# Patient Record
Sex: Female | Born: 1965 | Race: White | Hispanic: Yes | Smoking: Never smoker
Health system: Southern US, Community
[De-identification: ages and names within clinical notes are randomized; demographics above are authoritative.]

## PROBLEM LIST (undated history)

## (undated) DIAGNOSIS — B029 Zoster without complications: Secondary | ICD-10-CM

## (undated) DIAGNOSIS — T7840XA Allergy, unspecified, initial encounter: Secondary | ICD-10-CM

## (undated) DIAGNOSIS — E785 Hyperlipidemia, unspecified: Secondary | ICD-10-CM

## (undated) DIAGNOSIS — G43909 Migraine, unspecified, not intractable, without status migrainosus: Secondary | ICD-10-CM

## (undated) HISTORY — DX: Zoster without complications: B02.9

## (undated) HISTORY — DX: Allergy, unspecified, initial encounter: T78.40XA

## (undated) HISTORY — DX: Migraine, unspecified, not intractable, without status migrainosus: G43.909

---

## 1995-07-01 HISTORY — PX: TUBAL LIGATION: SHX77

## 1999-01-23 ENCOUNTER — Ambulatory Visit (HOSPITAL_COMMUNITY): Admission: RE | Admit: 1999-01-23 | Discharge: 1999-01-23 | Payer: Self-pay | Admitting: Family Medicine

## 1999-01-23 ENCOUNTER — Encounter: Payer: Self-pay | Admitting: Family Medicine

## 1999-06-14 ENCOUNTER — Emergency Department (HOSPITAL_COMMUNITY): Admission: EM | Admit: 1999-06-14 | Discharge: 1999-06-14 | Payer: Self-pay | Admitting: Emergency Medicine

## 1999-07-01 DIAGNOSIS — T7840XA Allergy, unspecified, initial encounter: Secondary | ICD-10-CM

## 1999-07-01 HISTORY — DX: Allergy, unspecified, initial encounter: T78.40XA

## 2000-01-23 ENCOUNTER — Ambulatory Visit (HOSPITAL_COMMUNITY): Admission: RE | Admit: 2000-01-23 | Discharge: 2000-01-23 | Payer: Self-pay | Admitting: Family Medicine

## 2000-01-23 ENCOUNTER — Encounter: Payer: Self-pay | Admitting: Family Medicine

## 2000-08-03 ENCOUNTER — Encounter: Payer: Self-pay | Admitting: Emergency Medicine

## 2000-08-03 ENCOUNTER — Emergency Department (HOSPITAL_COMMUNITY): Admission: EM | Admit: 2000-08-03 | Discharge: 2000-08-03 | Payer: Self-pay | Admitting: Emergency Medicine

## 2001-09-16 ENCOUNTER — Ambulatory Visit (HOSPITAL_COMMUNITY): Admission: RE | Admit: 2001-09-16 | Discharge: 2001-09-16 | Payer: Self-pay | Admitting: Gastroenterology

## 2002-05-12 ENCOUNTER — Ambulatory Visit (HOSPITAL_COMMUNITY): Admission: RE | Admit: 2002-05-12 | Discharge: 2002-05-12 | Payer: Self-pay | Admitting: Family Medicine

## 2003-01-04 ENCOUNTER — Other Ambulatory Visit: Admission: RE | Admit: 2003-01-04 | Discharge: 2003-01-04 | Payer: Self-pay | Admitting: *Deleted

## 2004-01-15 ENCOUNTER — Ambulatory Visit (HOSPITAL_COMMUNITY): Admission: RE | Admit: 2004-01-15 | Discharge: 2004-01-15 | Payer: Self-pay | Admitting: Family Medicine

## 2004-05-01 ENCOUNTER — Ambulatory Visit: Payer: Self-pay | Admitting: Family Medicine

## 2004-05-02 ENCOUNTER — Ambulatory Visit: Payer: Self-pay | Admitting: *Deleted

## 2004-05-30 ENCOUNTER — Encounter (INDEPENDENT_AMBULATORY_CARE_PROVIDER_SITE_OTHER): Payer: Self-pay | Admitting: Specialist

## 2004-05-30 ENCOUNTER — Ambulatory Visit: Payer: Self-pay | Admitting: Family Medicine

## 2004-05-30 ENCOUNTER — Other Ambulatory Visit: Admission: RE | Admit: 2004-05-30 | Discharge: 2004-05-30 | Payer: Self-pay | Admitting: Family Medicine

## 2004-09-02 ENCOUNTER — Ambulatory Visit: Payer: Self-pay | Admitting: Family Medicine

## 2004-09-11 ENCOUNTER — Ambulatory Visit: Payer: Self-pay | Admitting: Internal Medicine

## 2004-10-10 ENCOUNTER — Other Ambulatory Visit: Admission: RE | Admit: 2004-10-10 | Discharge: 2004-10-10 | Payer: Self-pay | Admitting: Family Medicine

## 2004-10-10 ENCOUNTER — Ambulatory Visit: Payer: Self-pay | Admitting: Family Medicine

## 2004-10-24 ENCOUNTER — Ambulatory Visit: Payer: Self-pay | Admitting: Family Medicine

## 2004-11-15 ENCOUNTER — Ambulatory Visit: Payer: Self-pay | Admitting: Family Medicine

## 2005-03-12 ENCOUNTER — Ambulatory Visit: Payer: Self-pay | Admitting: Internal Medicine

## 2005-06-20 ENCOUNTER — Ambulatory Visit: Payer: Self-pay | Admitting: Family Medicine

## 2005-07-19 ENCOUNTER — Emergency Department (HOSPITAL_COMMUNITY): Admission: EM | Admit: 2005-07-19 | Discharge: 2005-07-19 | Payer: Self-pay | Admitting: Family Medicine

## 2005-07-22 ENCOUNTER — Ambulatory Visit: Payer: Self-pay | Admitting: Family Medicine

## 2005-12-29 ENCOUNTER — Ambulatory Visit: Payer: Self-pay | Admitting: Family Medicine

## 2005-12-30 ENCOUNTER — Ambulatory Visit (HOSPITAL_COMMUNITY): Admission: RE | Admit: 2005-12-30 | Discharge: 2005-12-30 | Payer: Self-pay | Admitting: Family Medicine

## 2006-01-21 ENCOUNTER — Ambulatory Visit: Payer: Self-pay | Admitting: Obstetrics and Gynecology

## 2006-01-21 ENCOUNTER — Encounter: Payer: Self-pay | Admitting: Obstetrics and Gynecology

## 2006-01-27 ENCOUNTER — Ambulatory Visit (HOSPITAL_COMMUNITY): Admission: RE | Admit: 2006-01-27 | Discharge: 2006-01-27 | Payer: Self-pay | Admitting: Obstetrics and Gynecology

## 2006-02-16 ENCOUNTER — Ambulatory Visit: Payer: Self-pay | Admitting: Family Medicine

## 2006-06-15 ENCOUNTER — Emergency Department (HOSPITAL_COMMUNITY): Admission: EM | Admit: 2006-06-15 | Discharge: 2006-06-15 | Payer: Self-pay | Admitting: Family Medicine

## 2006-07-17 ENCOUNTER — Ambulatory Visit: Payer: Self-pay | Admitting: Family Medicine

## 2006-11-02 ENCOUNTER — Ambulatory Visit: Payer: Self-pay | Admitting: Family Medicine

## 2006-11-20 ENCOUNTER — Emergency Department (HOSPITAL_COMMUNITY): Admission: EM | Admit: 2006-11-20 | Discharge: 2006-11-20 | Payer: Self-pay | Admitting: Family Medicine

## 2006-11-25 ENCOUNTER — Emergency Department (HOSPITAL_COMMUNITY): Admission: EM | Admit: 2006-11-25 | Discharge: 2006-11-25 | Payer: Self-pay | Admitting: Emergency Medicine

## 2006-12-08 ENCOUNTER — Ambulatory Visit: Payer: Self-pay | Admitting: Family Medicine

## 2007-01-11 ENCOUNTER — Ambulatory Visit: Payer: Self-pay | Admitting: Family Medicine

## 2007-01-11 ENCOUNTER — Encounter (INDEPENDENT_AMBULATORY_CARE_PROVIDER_SITE_OTHER): Payer: Self-pay | Admitting: Family Medicine

## 2007-03-17 ENCOUNTER — Encounter (INDEPENDENT_AMBULATORY_CARE_PROVIDER_SITE_OTHER): Payer: Self-pay | Admitting: *Deleted

## 2007-04-01 ENCOUNTER — Ambulatory Visit: Payer: Self-pay | Admitting: Family Medicine

## 2007-07-08 ENCOUNTER — Ambulatory Visit: Payer: Self-pay | Admitting: Family Medicine

## 2007-07-12 ENCOUNTER — Ambulatory Visit (HOSPITAL_COMMUNITY): Admission: RE | Admit: 2007-07-12 | Discharge: 2007-07-12 | Payer: Self-pay | Admitting: Family Medicine

## 2007-08-12 ENCOUNTER — Emergency Department (HOSPITAL_COMMUNITY): Admission: EM | Admit: 2007-08-12 | Discharge: 2007-08-12 | Payer: Self-pay | Admitting: Family Medicine

## 2007-08-16 ENCOUNTER — Ambulatory Visit: Payer: Self-pay | Admitting: Family Medicine

## 2007-10-27 ENCOUNTER — Emergency Department (HOSPITAL_COMMUNITY): Admission: EM | Admit: 2007-10-27 | Discharge: 2007-10-27 | Payer: Self-pay | Admitting: Emergency Medicine

## 2008-01-13 ENCOUNTER — Ambulatory Visit: Payer: Self-pay | Admitting: Internal Medicine

## 2008-01-17 ENCOUNTER — Ambulatory Visit (HOSPITAL_COMMUNITY): Admission: RE | Admit: 2008-01-17 | Discharge: 2008-01-17 | Payer: Self-pay | Admitting: Family Medicine

## 2008-02-10 ENCOUNTER — Emergency Department (HOSPITAL_COMMUNITY): Admission: EM | Admit: 2008-02-10 | Discharge: 2008-02-10 | Payer: Self-pay | Admitting: Family Medicine

## 2008-07-24 ENCOUNTER — Ambulatory Visit: Payer: Self-pay | Admitting: Internal Medicine

## 2008-08-11 ENCOUNTER — Ambulatory Visit: Payer: Self-pay | Admitting: Family Medicine

## 2008-08-11 LAB — CONVERTED CEMR LAB
ALT: 14 units/L (ref 0–35)
AST: 18 units/L (ref 0–37)
Alkaline Phosphatase: 64 units/L (ref 39–117)
BUN: 18 mg/dL (ref 6–23)
Basophils Absolute: 0.1 10*3/uL (ref 0.0–0.1)
Chloride: 105 meq/L (ref 96–112)
Creatinine, Ser: 0.88 mg/dL (ref 0.40–1.20)
Eosinophils Absolute: 0.3 10*3/uL (ref 0.0–0.7)
Glucose, Bld: 85 mg/dL (ref 70–99)
Hemoglobin: 13.7 g/dL (ref 12.0–15.0)
Monocytes Absolute: 0.5 10*3/uL (ref 0.1–1.0)
Neutrophils Relative %: 42 % — ABNORMAL LOW (ref 43–77)
Platelets: 265 10*3/uL (ref 150–400)
Potassium: 4.2 meq/L (ref 3.5–5.3)
RBC: 4.72 M/uL (ref 3.87–5.11)
RDW: 13.3 % (ref 11.5–15.5)
Sodium: 140 meq/L (ref 135–145)
Vit D, 25-Hydroxy: 17 ng/mL — ABNORMAL LOW (ref 30–89)
WBC: 7.6 10*3/uL (ref 4.0–10.5)

## 2008-08-21 ENCOUNTER — Ambulatory Visit: Payer: Self-pay | Admitting: Family Medicine

## 2008-09-08 ENCOUNTER — Emergency Department (HOSPITAL_COMMUNITY): Admission: EM | Admit: 2008-09-08 | Discharge: 2008-09-08 | Payer: Self-pay | Admitting: Family Medicine

## 2009-02-08 ENCOUNTER — Ambulatory Visit: Payer: Self-pay | Admitting: Family Medicine

## 2009-02-08 LAB — CONVERTED CEMR LAB: TSH: 0.979 microintl units/mL (ref 0.350–4.500)

## 2009-03-12 ENCOUNTER — Ambulatory Visit (HOSPITAL_COMMUNITY): Admission: RE | Admit: 2009-03-12 | Discharge: 2009-03-12 | Payer: Self-pay | Admitting: Family Medicine

## 2009-03-14 ENCOUNTER — Ambulatory Visit: Payer: Self-pay | Admitting: Internal Medicine

## 2009-03-14 ENCOUNTER — Other Ambulatory Visit: Admission: RE | Admit: 2009-03-14 | Discharge: 2009-03-14 | Payer: Self-pay | Admitting: Family Medicine

## 2009-03-14 ENCOUNTER — Encounter: Payer: Self-pay | Admitting: Family Medicine

## 2009-03-14 LAB — CONVERTED CEMR LAB
Protein, ur: NEGATIVE mg/dL
Specific Gravity, Urine: 1.01 (ref 1.005–1.030)
Urobilinogen, UA: 0.2 (ref 0.0–1.0)

## 2009-03-20 ENCOUNTER — Ambulatory Visit: Payer: Self-pay | Admitting: Family Medicine

## 2009-03-20 ENCOUNTER — Encounter (INDEPENDENT_AMBULATORY_CARE_PROVIDER_SITE_OTHER): Payer: Self-pay | Admitting: Family Medicine

## 2009-03-20 LAB — CONVERTED CEMR LAB
Chlamydia, DNA Probe: NEGATIVE
GC Probe Amp, Genital: NEGATIVE

## 2009-05-15 ENCOUNTER — Ambulatory Visit: Payer: Self-pay | Admitting: Family Medicine

## 2009-07-04 ENCOUNTER — Emergency Department (HOSPITAL_COMMUNITY): Admission: EM | Admit: 2009-07-04 | Discharge: 2009-07-04 | Payer: Self-pay | Admitting: Family Medicine

## 2009-08-22 ENCOUNTER — Emergency Department (HOSPITAL_COMMUNITY): Admission: EM | Admit: 2009-08-22 | Discharge: 2009-08-22 | Payer: Self-pay | Admitting: Emergency Medicine

## 2009-10-20 ENCOUNTER — Emergency Department (HOSPITAL_COMMUNITY): Admission: EM | Admit: 2009-10-20 | Discharge: 2009-10-20 | Payer: Self-pay | Admitting: Family Medicine

## 2009-11-02 ENCOUNTER — Ambulatory Visit: Payer: Self-pay | Admitting: Family Medicine

## 2010-01-23 ENCOUNTER — Ambulatory Visit: Payer: Self-pay | Admitting: Internal Medicine

## 2010-01-24 ENCOUNTER — Encounter (INDEPENDENT_AMBULATORY_CARE_PROVIDER_SITE_OTHER): Payer: Self-pay | Admitting: Adult Health

## 2010-02-26 ENCOUNTER — Ambulatory Visit: Payer: Self-pay | Admitting: Family Medicine

## 2010-04-01 ENCOUNTER — Emergency Department (HOSPITAL_COMMUNITY): Admission: EM | Admit: 2010-04-01 | Discharge: 2010-04-01 | Payer: Self-pay | Admitting: Emergency Medicine

## 2010-09-05 ENCOUNTER — Other Ambulatory Visit: Payer: Self-pay | Admitting: Family Medicine

## 2010-09-05 ENCOUNTER — Encounter (INDEPENDENT_AMBULATORY_CARE_PROVIDER_SITE_OTHER): Payer: Self-pay | Admitting: Family Medicine

## 2010-09-05 LAB — CONVERTED CEMR LAB
AST: 19 units/L (ref 0–37)
Alkaline Phosphatase: 65 units/L (ref 39–117)
Basophils Absolute: 0.1 10*3/uL (ref 0.0–0.1)
Basophils Relative: 1 % (ref 0–1)
Chloride: 103 meq/L (ref 96–112)
Cholesterol: 201 mg/dL — ABNORMAL HIGH (ref 0–200)
Creatinine, Ser: 0.74 mg/dL (ref 0.40–1.20)
Eosinophils Absolute: 0.2 10*3/uL (ref 0.0–0.7)
Eosinophils Relative: 4 % (ref 0–5)
Glucose, Bld: 89 mg/dL (ref 70–99)
HDL: 49 mg/dL (ref >39)
Neutrophils Relative %: 44 % (ref 43–77)
Potassium: 4.2 meq/L (ref 3.5–5.3)
RDW: 13.4 % (ref 11.5–15.5)
Sodium: 139 meq/L (ref 135–145)
Total CHOL/HDL Ratio: 4.1
Triglycerides: 163 mg/dL — ABNORMAL HIGH (ref <150)
Vit D, 25-Hydroxy: 32 ng/mL (ref 30–89)

## 2010-09-17 LAB — POCT URINALYSIS DIP (DEVICE)
Nitrite: NEGATIVE
Protein, ur: NEGATIVE mg/dL
Specific Gravity, Urine: <=1.005 (ref 1.005–1.030)
pH: 6 (ref 5.0–8.0)

## 2010-09-17 LAB — POCT I-STAT, CHEM 8
Calcium, Ion: 1.14 mmol/L (ref 1.12–1.32)
Chloride: 104 mEq/L (ref 96–112)
Creatinine, Ser: 0.9 mg/dL (ref 0.4–1.2)
Glucose, Bld: 93 mg/dL (ref 70–99)
HCT: 41 % (ref 36.0–46.0)
Sodium: 141 mEq/L (ref 135–145)

## 2010-10-10 LAB — POCT URINALYSIS DIP (DEVICE)
Bilirubin Urine: NEGATIVE
Ketones, ur: NEGATIVE mg/dL
Nitrite: NEGATIVE
Protein, ur: NEGATIVE mg/dL
Specific Gravity, Urine: 1.015 (ref 1.005–1.030)
Urobilinogen, UA: 0.2 mg/dL (ref 0.0–1.0)
pH: 7.5 (ref 5.0–8.0)

## 2010-11-15 NOTE — Procedures (Signed)
[place]  Patient:    Rachel Newton, Rachel Newton Visit Number: [id number] MRN: [id number]          Service Type: END Location: ENDO Attending Physician:  Charna Elizabeth Dictated by:   Anselmo Rod, M.D. Proc. Date: 09/16/01 Admit Date:  09/16/2001 Discharge Date: 09/16/2001   CC:         Aura Dials, M.D.  Juan H. Lily Peer, M.D.   Procedure Report  DATE OF BIRTH:  [birth year]  PROCEDURE PERFORMED:  Flexible sigmoidoscopy.  ENDOSCOPIST:  Anselmo Rod, M.D.  INSTRUMENT USED:  Olympus video colonoscope.  INDICATIONS FOR PROCEDURE:  Left lower quadrant pain in a 45 year old Hispanic female, rule out diverticulosis coli, etc.  PREPROCEDURE PREPARATION:  Informed consent was procured from the patient. The patient was fasted for eight hours prior to the procedure, and prepped with two Fleets enemas the morning of the procedure.  PREPROCEDURE PHYSICAL:  VITAL SIGNS:  Stable.  NECK:  Supple.  CHEST:  Clear to auscultation, S1 and S1 regular.  ABDOMEN:  Soft with normal bowel sounds.  DESCRIPTION OF PROCEDURE:  The patient was placed in the left lateral decubitus position, and sedated with 40 mg of Demerol and 4 mg of Versed intravenously.  Once the patient was adequately sedated, maintained on low flow oxygen and continuous cardiac monitoring, the Olympus video colonoscope was advanced from the rectum to 80 cm without difficulty.  The entire colonic mucosa appeared healthy without erosions, ulcerations, masses, polyps, or diverticulosis.  A small internal hemorrhoid was seen on retroflexion in the rectum.  The patient tolerated the procedure well without complication.  IMPRESSION:  Normal flexible sigmoidoscopy up to 80 cm except for small internal hemorrhoid.  No masses or polyps seen.  RECOMMENDATIONS: 1. High fiber diet has been recommended for the patient. 2. Outpatient follow up in the next two weeks. Dictated by:   Anselmo Rod, M.D. Attending Physician:  Charna Elizabeth DD:  09/16/01 TD:  09/19/01 Job: [id number]

## 2010-11-15 NOTE — Group Therapy Note (Signed)
NAME:  Rachel Newton, Rachel Newton NO.:  [id number]   MEDICAL RECORD NO.:  [id number]          PATIENT TYPE:   LOCATION:  [facility]                   FACILITY:  [facility]   PHYSICIAN:  Argentina Donovan, MD        DATE OF BIRTH:  [birth year]   DATE OF SERVICE:  07/22/2005                                    CLINIC NOTE   CHIEF COMPLAINT:  Follow up for cervical dysplasia.   SUBJECTIVE:  The patient is a 45 year old Hispanic female with a history of  low SIL on a Pap.  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  She is here for her routine q.6 month Pap  smear.   The patient also admits to having rectal pain with defecation and an  occasional bright red blood on the toilet paper when she goes to bathroom  and has a bowel movement.  In addition to the rectal pain with defecation,  she is also having episodic constipation.   PHYSICAL EXAMINATION:  VITAL SIGNS:  Noted.  GENERAL:  In no apparent distress.  Alert and oriented x3.  Obese Hispanic  female.  PELVIC EXAM:  External vaginal exam is normal.  External genital exam is  normal. The patient has no cervical motion tenderness, no adnexal tenderness  and no palpable uterine fibroids.  Pap smear was performed.  RECTAL EXAMINATION:  The patient has no external hemorrhoids or anal  fissures.  She does have one palpable internal hemorrhoid at approximately  12 o'clock 2 cm into the rectum.  This was exquisitely tender to palpation.   ASSESSMENT AND PLAN:  1.  Cervical dysplasia.  We will continue her q.6 month Pap smears until she      is passes screening and we will follow up her Pap smear that was      obtained today.  2.  Hemorrhoids.  I gave the patient a prescription for Anusol HC cream and      instructed her to apply t.i.d. to q.i.d. p.r.n. for pain.  I also gave      her prescription for Dulcolax that she is to take 10 mg p.o. daily      p.r.n. for constipation in addition to daily  Metamucil.     ______________________________  Dr. Tanya Nones    ______________________________  Argentina Donovan, MD    DP/MEDQ  D:  07/22/2005  T:  07/22/2005  Job:  [id number]

## 2012-06-28 ENCOUNTER — Ambulatory Visit: Payer: Self-pay | Admitting: Family Medicine

## 2012-06-28 VITALS — BP 130/100 | HR 88 | Temp 98.7°F | Resp 18 | Wt 179.0 lb

## 2012-06-28 DIAGNOSIS — N898 Other specified noninflammatory disorders of vagina: Secondary | ICD-10-CM

## 2012-06-28 DIAGNOSIS — L293 Anogenital pruritus, unspecified: Secondary | ICD-10-CM

## 2012-06-28 DIAGNOSIS — R350 Frequency of micturition: Secondary | ICD-10-CM

## 2012-06-28 DIAGNOSIS — B029 Zoster without complications: Secondary | ICD-10-CM

## 2012-06-28 LAB — POCT WET PREP WITH KOH: Yeast Wet Prep HPF POC: NEGATIVE

## 2012-06-28 LAB — POCT UA - MICROSCOPIC ONLY: Yeast, UA: NEGATIVE

## 2012-06-28 LAB — POCT URINALYSIS DIPSTICK
Bilirubin, UA: NEGATIVE
Glucose, UA: NEGATIVE
Ketones, UA: NEGATIVE
Nitrite, UA: NEGATIVE
pH, UA: 5.5

## 2012-06-28 MED ORDER — ACYCLOVIR 400 MG PO TABS: 800.0000 mg | ORAL_TABLET | Freq: Every day | ORAL | Status: DC

## 2012-06-28 MED ORDER — CEPHALEXIN 500 MG PO CAPS: 500.0000 mg | ORAL_CAPSULE | Freq: Two times a day (BID) | ORAL | Status: DC

## 2012-06-28 NOTE — Progress Notes (Signed)
[facility] [address], [place] [address] [phone]  Date:  06/28/2012   Name:  Rachel Newton   DOB:  Nov 28, 1965   MRN:  [id number]  PCP:  No primary provider on file.    Chief Complaint: rash under left breast and Otalgia   History of Present Illness:  Rachel Newton is a 46 y.o. very pleasant female patient who presents with the following:  Yesterday she noted onset of a painful rash on her left chest wall.  She does not have any other symptoms such as fever or chills. The rash is only on the left side.  She also notes pain in her left ear.    She is also has some vaginal itching.  A couple of weeks ago she noted urinary symptoms including urinary frequency and burning, but this is now better.  She took some sort of medication from [place] "for infection".    She is menopausal but she has noted some blood in her urine over the last couple of days.    Otherwise she is generally healthy, no known chronic conditions   There is no problem list on file for this patient.   Past Medical History  Diagnosis Date  . Allergy     No past surgical history on file.  History  Substance Use Topics  . Smoking status: Never Smoker   . Smokeless tobacco: Not on file  . Alcohol Use: No    No family history on file.  No Known Allergies  Medication list has been reviewed and updated.  No current outpatient prescriptions on file prior to visit.    Review of Systems:  As per HPI- otherwise negative.   Physical Examination: Filed Vitals:   06/28/12 0811  BP: 130/100  Pulse: 88  Temp: 98.7 F (37.1 C)  Resp: 18   Filed Vitals:   06/28/12 0811  Weight: 179 lb (81.194 kg)   There is no height on file to calculate BMI. Ideal Body Weight:    GEN: WDWN, NAD, Non-toxic, A & O x 3, overweight HEENT: Atraumatic, Normocephalic. Neck supple. No masses, No LAD. Bilateral TM wnl, oropharynx normal.  PEERL,EOMI.   Ears and Nose: No external  deformity. CV: RRR, No M/G/R. No JVD. No thrill. No extra heart sounds. PULM: CTA B, no wheezes, crackles, rhonchi. No retractions. No resp. distress. No accessory muscle use. ABD: S, NT, ND. No rebound. No HSM. Under her left breast is an area of vesicular rash, she is tender over the skin of her left side/ back in the same dermatome but no rash.  Consistent with shingles  EXTR: No c/c/e NEURO Normal gait.  PSYCH: Normally interactive. Conversant. Not depressed or anxious appearing.  Calm demeanor.  GU: mildly atrophic.  No unusual discharge, no redness or inflammation.  Normal BME, no adnexal masses or tenderness   Results for orders placed in visit on 06/28/12  POCT UA - MICROSCOPIC ONLY      Component Value Range   WBC, Ur, HPF, POC 0-2     RBC, urine, microscopic 2-5     Bacteria, U Microscopic trace     Mucus, UA negative     Epithelial cells, urine per micros 6-8     Crystals, Ur, HPF, POC neg     Casts, Ur, LPF, POC neg     Yeast, UA neg    POCT URINALYSIS DIPSTICK      Component Value Range   Color,  UA yellow     Clarity, UA cloudy     Glucose, UA neg     Bilirubin, UA neg     Ketones, UA neg     Spec Grav, UA 1.015     Blood, UA moderate     pH, UA 5.5     Protein, UA neg     Urobilinogen, UA 0.2     Nitrite, UA neg     Leukocytes, UA Negative    POCT WET PREP WITH KOH      Component Value Range   Trichomonas, UA Negative     Clue Cells Wet Prep HPF POC 2-3     Epithelial Wet Prep HPF POC 1-4     Yeast Wet Prep HPF POC neg     Bacteria Wet Prep HPF POC trace     RBC Wet Prep HPF POC 2-5     WBC Wet Prep HPF POC 0-2     KOH Prep POC Negative      Assessment and Plan: 1. Shingles  acyclovir (ZOVIRAX) 400 MG tablet  2. Urinary frequency  POCT UA - Microscopic Only, POCT urinalysis dipstick, cephALEXin (KEFLEX) 500 MG capsule, Urine culture  3. Vaginal itching  POCT Wet Prep with KOH   Shingles- given information in Spanish.  Avoid pregnant women and young  children, treat with acyclovir.  Note for work- she may return on 07/01/12.  Let me know if not getting better. OTC analgesics ok to use as needed  Likely UTI- await urine culture, treat with keflex BID for 7 days  Meds ordered this encounter  Medications  . acyclovir (ZOVIRAX) 400 MG tablet    Sig: Take 2 tablets (800 mg total) by mouth 5 (five) times daily.    Dispense:  70 tablet    Refill:  0  . cephALEXin (KEFLEX) 500 MG capsule    Sig: Take 1 capsule (500 mg total) by mouth 2 (two) times daily.    Dispense:  14 capsule    Refill:  0     Tarrance Januszewski, MD

## 2012-06-28 NOTE — Patient Instructions (Addendum)
We are going to treat you with acyclovir for shingles and keflex for a urinary tract infection.    Culebrilla (Shingles) La causa de la culebrilla es el mismo virus que causa la varicela. Las primeras sensaciones pueden ser de dolor u hormigueo. Luego de un par [employer] erupcin. La erupcin puede aparecer en cualquier parte del cuerpo. Es ms frecuente que el dolor sea prolongado en personas de edad [place]. Puede persistir desde [employer]. Hay medicamentos para evitar el dolor si se toman desde el inicio de los sntomas. CUIDADOS EN EL HOGAR  Coloque compresas fras en la erupcin.  Slo tome medicamentos si el mdico se lo ha indicado.  Puede usar una locin de calamina para Technical sales engineer picazn en la piel.  Evite tocar:  Bebs.  Nios que tienen la piel inflamada (eczema).  Personas que fueron sometidas a transplantes de rganos.  Personas con enfermedades crnicas, como leucemia y sida.  Si tiene [employer], podrn derivarlo a Music therapist.  Es muy importante que cumpla con las visitas para Animator.  Debe tratar de mantener la erupcin lejos de los ojos. SOLICITE AYUDA DE INMEDIATO SI:  Siente dolor en el rostro o los ojos.  Los medicamentos no lo ayudan.  El enrojecimiento o inflamacin se extiende.  Tiene fiebre.  Nota lneas rojas que salen de la zona afectada. ASEGRESE QUE:  Comprende estas instrucciones.  Controlar su enfermedad.  Solicitar ayuda de inmediato si no mejora o empeora. Document Released: 09/12/2008 Document Revised: 09/08/2011 [facility] Patient Information 2013 [place], [place].

## 2012-07-01 ENCOUNTER — Encounter: Payer: Self-pay | Admitting: Family Medicine

## 2012-07-07 ENCOUNTER — Ambulatory Visit: Payer: Self-pay | Admitting: Emergency Medicine

## 2012-07-07 VITALS — BP 134/68 | HR 60 | Temp 98.2°F | Resp 16 | Ht 60.5 in | Wt 183.2 lb

## 2012-07-07 DIAGNOSIS — B029 Zoster without complications: Secondary | ICD-10-CM

## 2012-07-07 HISTORY — DX: Zoster without complications: B02.9

## 2012-07-07 MED ORDER — HYDROCODONE-ACETAMINOPHEN 5-325 MG PO TABS: 1.0000 | ORAL_TABLET | Freq: Four times a day (QID) | ORAL | Status: DC | PRN

## 2012-07-07 NOTE — Progress Notes (Signed)
[facility] [address], [place] [address] [phone]  Date:  07/07/2012   Name:  Rachel Newton   DOB:  1965/08/27   MRN:  [id number]  PCP:  No primary provider on file.    Chief Complaint: Herpes Zoster   History of Present Illness:  Rachel Newton is a 47 y.o. very pleasant female patient who presents with the following:  Treated for shingles 12/30 with valtrex.  Has improved in that the rash has resolved.  No fever or chills.  Still having pain in radicular distribution.  No cough or coryza  There is no problem list on file for this patient.   Past Medical History  Diagnosis Date  . Allergy     No past surgical history on file.  History  Substance Use Topics  . Smoking status: Never Smoker   . Smokeless tobacco: Not on file  . Alcohol Use: No    No family history on file.  No Known Allergies  Medication list has been reviewed and updated.  Current Outpatient Prescriptions on File Prior to Visit  Medication Sig Dispense Refill  . cephALEXin (KEFLEX) 500 MG capsule Take 1 capsule (500 mg total) by mouth 2 (two) times daily.  14 capsule  0  . acyclovir (ZOVIRAX) 400 MG tablet Take 2 tablets (800 mg total) by mouth 5 (five) times daily.  70 tablet  0    Review of Systems:  As per HPI, otherwise negative.    Physical Examination: Filed Vitals:   07/07/12 1635  BP: 134/68  Pulse: 60  Temp: 98.2 F (36.8 C)  Resp: 16   Filed Vitals:   07/07/12 1635  Height: 5' 0.5" (1.537 m)  Weight: 183 lb 3.2 oz (83.099 kg)   Body mass index is 35.19 kg/(m^2). Ideal Body Weight: Weight in (lb) to have BMI = 25: 129.9    GEN: WDWN, NAD, Non-toxic, Alert & Oriented x 3 HEENT: Atraumatic, Normocephalic.  Ears and Nose: No external deformity. EXTR: No clubbing/cyanosis/edema NEURO: Normal gait.  PSYCH: Normally interactive. Conversant. Not depressed or anxious appearing.  Calm demeanor.  SKIN:  Rash left inframammary crease with no  vesicles.    Assessment and Plan: Shingles Resolved UTI vicodin   Carmelina Dane, MD

## 2012-07-07 NOTE — Patient Instructions (Addendum)
Culebrilla  (Shingles)  La causa de la culebrilla es el mismo virus que provoca la varicela (el virus varicella zoster o VZV). Generalmente aparece varios aos o dcadas despus de sufrir varicela. Por eso es ms frecuente entre adultos mayores de 50 aos. El virus se reactiva e irrumpe como una infeccin en una raz nerviosa.   SNTOMAS   La sensacin inicial puede ser de dolor. Este dolor generalmente se describe como:   Ardor.   Lacerante.   Punzante.   Hormigueo en la raz nerviosa.   Luego de un par de das aparecer una erupcin. La erupcin puede aparecer en cualquier zona del cuerpo y habitualmente de un solo lado (unilateral) siguiendo un patrn de banda o cinturn. Generalmente comienza como ampollas (vesculas) muy pequeas. Estas se secarn luego de 7 a 10 das. En general, no es un problema significativo, excepto por el dolor que causa.   Es ms frecuente que el dolor de larga duracin (crnico) se presente en personas de edad avanzada. Puede persistir desde meses hasta aos. Este trastorno se denomina neuralgia postherptica.  La culebrilla puede transformarse en una infeccin muy grave en algunas personas que sufren de SIDA, que tienen un sistema inmunolgico debilitado o en algunas formas de leucemia. Tambin puede llegar a ser grave si est tomando medicamentos por haber recibido un transplante u otros medicamentos que debilitan el sistema inmunolgico.  TRATAMIENTO  El profesional que lo asiste lo tratar con:   Medicacin antiviral.   Medicamentos antiinflamatorios.   Medicamentos para calmar el dolor.  Es muy importante hacer reposo en cama para evitar el dolor asociado al herpes zoster (neuralgia postherptica). Se recomienda la aplicacin de calor con una botella llena de agua caliente o una almohadilla elctrica, o aplicando presin con la mano, para aliviar el dolor o las molestias.  PREVENCIN  Hay disponible una vacuna que ayuda a la proteccin contra la varicela zoster. La Food  and Drug Administration aprob la vacuna para los individuos de 50 o ms aos de edad.  INSTRUCCIONES PARA EL CUIDADO DOMICILIARIO   Las compresas fras en la zona de la erupcin pueden ser de utilidad.   Solo tome medicamentos que se pueden comprar sin receta o recetados para el dolor, malestar o fiebre, como le indica el mdico.   Evite el contacto con:   Bebs.   Mujeres embarazadas.   Nios con eczema.   Personas mayores que han recibido un transplante.   Personas con enfermedades crnicas, como leucemia y SIDA.   Si la zona involucrada est en el rostro, ser derivado a un especialista para realizar un seguimiento. Es muy importante que cumpla con las visitas para el seguimiento. Esto le ayudar a evitar las complicaciones en los ojos y dolor crnico o discapacidad.  SOLICITE ATENCIN MDICA DE INMEDIATO SI:   Presenta algn dolor (cefalea) en la zona del rostro o los ojos. Deber controlar este problema con el profesional que lo asiste o con un oftalmlogo. Una infeccin en una parte del ojo (crnea) puede ser muy grave. Podra conducir a la ceguera.   No obtiene alivio de los medicamentos prescriptos.   El enrojecimiento o inflamacin se extiende.   La zona tratada se hincha o duele.   Tiene fiebre.   Nota alguna lnea roja o dolorosa que se extiende hacia fuera de la zona afectada en direccin al corazn (limpangitis).   El trastorno empeora o es diferente al que lo trajo a la consulta.  Document Released: 03/26/2005 Document Revised: 09/08/2011  

## 2013-06-30 DIAGNOSIS — G43909 Migraine, unspecified, not intractable, without status migrainosus: Secondary | ICD-10-CM

## 2013-06-30 HISTORY — DX: Migraine, unspecified, not intractable, without status migrainosus: G43.909

## 2013-08-17 ENCOUNTER — Ambulatory Visit: Payer: Self-pay | Admitting: Family Medicine

## 2013-08-17 VITALS — BP 124/82 | HR 70 | Temp 97.4°F | Resp 16 | Ht 59.5 in | Wt 184.0 lb

## 2013-08-17 DIAGNOSIS — N390 Urinary tract infection, site not specified: Secondary | ICD-10-CM

## 2013-08-17 DIAGNOSIS — R300 Dysuria: Secondary | ICD-10-CM

## 2013-08-17 DIAGNOSIS — R350 Frequency of micturition: Secondary | ICD-10-CM

## 2013-08-17 LAB — POCT URINALYSIS DIPSTICK
Bilirubin, UA: NEGATIVE
Glucose, UA: NEGATIVE
Ketones, UA: NEGATIVE
Leukocytes, UA: NEGATIVE
Nitrite, UA: NEGATIVE
Protein, UA: NEGATIVE
Spec Grav, UA: <=1.005
Urobilinogen, UA: 0.2
pH, UA: 6.5

## 2013-08-17 LAB — POCT UA - MICROSCOPIC ONLY
Bacteria, U Microscopic: NEGATIVE
Casts, Ur, LPF, POC: NEGATIVE
Crystals, Ur, HPF, POC: NEGATIVE
Mucus, UA: NEGATIVE
WBC, Ur, HPF, POC: NEGATIVE
Yeast, UA: NEGATIVE

## 2013-08-17 MED ORDER — CIPROFLOXACIN HCL 500 MG PO TABS: 500.0000 mg | ORAL_TABLET | Freq: Two times a day (BID) | ORAL | Status: DC

## 2013-08-17 NOTE — Progress Notes (Signed)
   Subjective:    Patient ID: Rachel Newton, female    DOB: [birth year], 48 y.o.   MRN: [id number]  HPI Pain in lower abdomen and vagina. Also has urinary hesitancy, frequency, and burning. Started over the weekend. Worsening. No fever. Started to get some back pain today. No vomiting or nausea. Has history of UTIs, this feels similar. No vaginal discharge. Currently sexually active. No concern for STI.   Review of Systems  All other systems reviewed and are negative.      Objective:   Physical Exam  Constitutional: She is oriented to person, place, and time. She appears well-developed and well-nourished.  HENT:  Head: Normocephalic and atraumatic.  Eyes: Conjunctivae are normal. Pupils are equal, round, and reactive to light.  Neck: Normal range of motion.  Cardiovascular: Normal rate and regular rhythm.   Pulmonary/Chest: Effort normal and breath sounds normal.  Abdominal: Soft. She exhibits no mass. There is tenderness. There is no rebound and no guarding.  Musculoskeletal: Normal range of motion.  Lymphadenopathy:    She has no cervical adenopathy.  Neurological: She is alert and oriented to person, place, and time.  Skin: Skin is warm and dry.  Psychiatric: She has a normal mood and affect. Her behavior is normal.  No CVA tenderness. Superpubic tenderness.  Results for orders placed in visit on 08/17/13  POCT URINALYSIS DIPSTICK      Result Value Ref Range   Color, UA yellow     Clarity, UA clear     Glucose, UA neg     Bilirubin, UA neg     Ketones, UA neg     Spec Grav, UA <=1.005     Blood, UA trace     pH, UA 6.5     Protein, UA neg     Urobilinogen, UA 0.2     Nitrite, UA neg     Leukocytes, UA Negative    POCT UA - MICROSCOPIC ONLY      Result Value Ref Range   WBC, Ur, HPF, POC neg     RBC, urine, microscopic 0-1     Bacteria, U Microscopic neg     Mucus, UA neg     Epithelial cells, urine per micros 0-1     Crystals, Ur, HPF, POC neg     Casts, Ur,  LPF, POC neg     Yeast, UA neg        Assessment & Plan:  #1. Dysuria. - Negative urine dip - However, symptoms very classic - Will send culture and treat with cipro - May also be chemical urethritis - feminine hygeine products. Encourage to avoid. - F/u PRN

## 2013-08-17 NOTE — Patient Instructions (Signed)
Thank you for coming in today  We will send your urine for culture Please avoid douching and feminine hygiene products We will call you with culture result Return for fever, vomiting, increased symptoms Take ciprofloxacin 2x per day for 3 days.  Infeccin urinaria  (Urinary Tract Infection)  La infeccin urinaria puede ocurrir en Corporate treasurercualquier lugar del tracto urinario. El tracto urinario es un sistema de drenaje del cuerpo por el que se eliminan los desechos y el exceso de [place]. El tracto urinario est formado por dos riones, dos urteres, la vejiga y Engineer, miningla uretra. Los riones son rganos que tienen forma de frijol. Cada rin tiene aproximadamente el tamao del puo. Estn situados debajo de las Lake Villagecostillas, uno a cada lado de la columna vertebral CAUSAS  La causa de la infeccin son los microbios, que son organismos microscpicos, que incluyen hongos, virus, y bacterias. Estos organismos son tan pequeos que slo pueden verse a travs del microscopio. Las bacterias son los microorganismos que ms comnmente causan infecciones urinarias.  SNTOMAS  Los sntomas pueden variar segn la edad y el sexo del paciente y por la ubicacin de la infeccin. Los sntomas en las mujeres jvenes incluyen la necesidad frecuente e intensa de orinar y una sensacin dolorosa de ardor en la vejiga o en la uretra durante la miccin. Las mujeres y los hombres mayores podrn sentir cansancio, temblores y debilidad y Futures tradersentir dolores musculares y Engineer, miningdolor abdominal. Si tiene Gardendalefiebre, puede significar que la infeccin est en los riones. Otros sntomas son dolor en la espalda o en los lados debajo de las [place] Valleycostillas, nuseas y vmitos.  DIAGNSTICO  Para diagnosticar una infeccin urinaria, el mdico le preguntar acerca de sus sntomas. Genuine Partsambin le solicitar una Chestermuestra de Comorosorina. La muestra de orina se analiza para Engineer, manufacturingdetectar bacterias y glbulos blancos de Risk managerla sangre. Los glbulos blancos se forman en el organismo para ayudar a Artistcombatir  las infecciones.  TRATAMIENTO  Por lo general, las infecciones urinarias pueden tratarse con medicamentos. Debido a que la Harley-Davidsonmayora de las infecciones son causadas por bacterias, por lo general pueden tratarse con antibiticos. La eleccin del antibitico y la duracin del tratamiento depender de sus sntomas y el tipo de bacteria causante de la infeccin.  INSTRUCCIONES PARA EL CUIDADO EN EL HOGAR   Si le recetaron antibiticos, tmelos exactamente como su mdico le indique. Termine el medicamento aunque se sienta mejor despus de haber tomado slo algunos.  Beba gran cantidad de lquido para mantener la orina de tono claro o color amarillo plido.  Evite la cafena, el t y las 250 Hospital Placebebidas gaseosas. Estas sustancias irritan la vejiga.  Vaciar la vejiga con frecuencia. Evite retener la orina durante largos perodos.  Vace la vejiga antes y despus de Management consultanttener relaciones sexuales.  Despus de mover el intestino, las mujeres deben higienizarse la regin perineal desde adelante hacia atrs. Use slo un papel tissue por vez. SOLICITE ATENCIN MDICA SI:   Siente dolor en la espalda.  Le sube la fiebre.  Los sntomas no mejoran luego de [address] das. SOLICITE ATENCIN MDICA DE INMEDIATO SI:   Siente dolor intenso en la espalda o en la zona inferior del abdomen.  Comienza a sentir escalofros.  Tiene nuseas o vmitos.  Tiene una sensacin continua de quemazn o molestias al ConocoPhillipsorinar. ASEGRESE DE QUE:   Comprende estas instrucciones.  Controlar su enfermedad.  Solicitar ayuda de inmediato si no mejora o empeora. Document Released: 03/26/2005 Document Revised: 03/10/2012 [facility] Patient Information 2014 [place]Care, [place].  Uretritis - Adultos (  Urethritis, Adult) La uretritis es [place] inflamacin del tubo a travs del cual sale orina de la vejiga (uretra).  CAUSAS La causa de la uretritis suele ser una infeccin en la uretra. La infeccin puede ser viral, como herpes. Tambin puede ser  [place], Nance Pew. FACTORES DE RIESGO Los factores de riesgo de la uretritis incluyen:  Warehouse manager sexo sin usar condn.  Tener mltiples parejas sexuales.  Tener una higiene deficiente. SIGNOS Y SNTOMAS Los sntomas de la uretritis son menos perceptibles en las mujeres que en los hombres. Estos sntomas incluyen:  Sensacin de ardor al Geographical information systems officer (disuria).  Secrecin por Engineer, mining.  Sangre en la orina (hematuria).  Orinar ms que lo habitual. DIAGNSTICO  Para confirmar un diagnstico de uretritis, su mdico har lo siguiente:  Preguntar sobre su historial sexual.  Education officer, environmental un examen fsico.  Pedir una muestra de orina para Civil Service fast streamer.  Utilizar un hisopo de algodn para Engineer, maintenance (IT) de la uretra para pruebas en el laboratorio. TRATAMIENTO  Es importante tratar la uretritis. Segn la causa, la uretritis no tratada puede producir infecciones genitales graves y posiblemente, la infertilidad. La uretritis causada por una infeccin bacteriana se trata con antibiticos. Todas las parejas sexuales deben tratarse.  INSTRUCCIONES PARA EL CUIDADO EN EL HOGAR  No tenga sexo hasta que se conozcan los [address] pruebas y se complete el tratamiento, aunque los sntomas desaparezcan antes de Art gallery manager.  Finalice todos los [employer] se Insurance claims handler. SOLICITE ATENCIN MDICA SI:   Sus sntomas no mejoran en [employer].  Los sntomas empeoran.  Siente dolor abdominal o plvico (en las mujeres).  Siente dolor en las articulaciones. SOLICITE ATENCIN MDICA DE INMEDIATO SI:   Tiene fiebre de 101,32F (38,8C) o ms.  Siente un dolor intenso en el abdomen, la espalda o un lado del cuerpo.  Ha vomitado repetidas veces. Document Released: 03/26/2005 Document Revised: 04/06/2013 [facility] Patient Information 2014 [place], [place].

## 2013-08-19 LAB — URINE CULTURE
COLONY COUNT: NO GROWTH
Organism ID, Bacteria: NO GROWTH

## 2013-10-18 ENCOUNTER — Ambulatory Visit: Payer: Self-pay | Attending: Internal Medicine

## 2013-10-24 ENCOUNTER — Ambulatory Visit: Payer: Self-pay | Admitting: Family Medicine

## 2013-10-24 VITALS — BP 118/62 | HR 79 | Temp 98.3°F | Resp 16 | Ht 60.5 in | Wt 181.4 lb

## 2013-10-24 DIAGNOSIS — J302 Other seasonal allergic rhinitis: Secondary | ICD-10-CM

## 2013-10-24 DIAGNOSIS — J309 Allergic rhinitis, unspecified: Secondary | ICD-10-CM

## 2013-10-24 DIAGNOSIS — H109 Unspecified conjunctivitis: Secondary | ICD-10-CM

## 2013-10-24 MED ORDER — FLUTICASONE PROPIONATE 50 MCG/ACT NA SUSP: 2.0000 | Freq: Every day | NASAL | Status: DC

## 2013-10-24 MED ORDER — OLOPATADINE HCL 0.1 % OP SOLN: 1.0000 [drp] | Freq: Two times a day (BID) | OPHTHALMIC | Status: DC

## 2013-10-24 NOTE — Patient Instructions (Signed)

## 2013-10-24 NOTE — Progress Notes (Signed)
 Chief Complaint:  Chief Complaint  Patient presents with  . Eye Problem    x 3 days   awoke with blood in eye and eye pressure    HPI: Rachel Newton is a 48 y.o. female who is here for  3 day h/o bialteral eye itching and right eye redness and bloody spot in her eye, She denies rubbing her eye or sneezing or blowing her eye when this occurred, she denies HTN, eye pain, or double vision, tunnel vision.  She has had happened in the past.  She feels anxious and that feeling came over her when this occurred. No CP, no SOB. . She has had blurred vision, + floatesr. No zig zag line.  Last eye exam was in 1.5 years She deneis glaucoma or cataracts.  She has allergies. She takes allerga, seh has tried rx drops that was rx to her daughter from the pediatrician with some relief.     Past Medical History  Diagnosis Date  . Allergy    No past surgical history on file. History   Social History  . Marital Status: Married    Spouse Name: N/A    Number of Children: N/A  . Years of Education: N/A   Social History Main Topics  . Smoking status: Never Smoker   . Smokeless tobacco: None  . Alcohol Use: No  . Drug Use: No  . Sexual Activity: None   Other Topics Concern  . None   Social History Narrative  . None   No family history on file. No Known Allergies Prior to Admission medications   Not on File     ROS: The patient denies fevers, chills, night sweats, unintentional weight loss, chest pain, palpitations, wheezing, dyspnea on exertion, nausea, vomiting, abdominal pain, dysuria, hematuria, melena, numbness, weakness, or tingling.   All other systems have been reviewed and were otherwise negative with the exception of those mentioned in the HPI and as above.    PHYSICAL EXAM: Filed Vitals:   10/24/13 1550  BP: 118/62  Pulse: 79  Temp: 98.3 F (36.8 C)  Resp: 16   Filed Vitals:   10/24/13 1550  Height: 5' 0.5" (1.537 m)  Weight: 181 lb 6.4 oz (82.283 kg)    Body mass index is 34.83 kg/(m^2).  General: Alert, no acute distress HEENT:  Normocephalic, atraumatic, oropharynx patent. EOMI, PERRLA, + conjunctivitis with minimal broken vessel conjunctival hemorrhage on right eye. Fundoscopic exam nl Cardiovascular:  Regular rate and rhythm, no rubs murmurs or gallops.  No Carotid bruits, radial pulse intact. No pedal edema.  Respiratory: Clear to auscultation bilaterally.  No wheezes, rales, or rhonchi.  No cyanosis, no use of accessory musculature GI: No organomegaly, abdomen is soft and non-tender, positive bowel sounds.  No masses. Skin: No rashes. Neurologic: Facial musculature symmetric. Psychiatric: Patient is appropriate throughout our interaction. Lymphatic: No cervical lymphadenopathy Musculoskeletal: Gait intact.   LABS: Results for orders placed in visit on 08/17/13  URINE CULTURE      Result Value Ref Range   Colony Count NO GROWTH     Organism ID, Bacteria NO GROWTH    POCT URINALYSIS DIPSTICK      Result Value Ref Range   Color, UA yellow     Clarity, UA clear     Glucose, UA neg     Bilirubin, UA neg     Ketones, UA neg     Spec Grav, UA <=1.005     Blood,  UA trace     pH, UA 6.5     Protein, UA neg     Urobilinogen, UA 0.2     Nitrite, UA neg     ukocytes, UA Negative    POCT UA - MICROSCOPIC ONLY      Result Value Ref Range   WBC, Ur, HPF, POC neg     RBC, urine, microscopic 0-1     Bacteria, U Microscopic neg     Mucus, UA neg     Epithelial cells, urine per micros 0-1     Crystals, Ur, HPF, POC neg     Casts, Ur, LPF, POC neg     Yeast, UA neg       EKG/XRAY:   Primary [facility]   ASSESSMENT/PLAN: Encounter Diagnoses  Name Primary?  . Conjunctivitis Yes  . Seasonal allergies    OTC nasacort and also zaditor She needs to see optometry to see if she ahs glaucoma or cataracts.  Snells visiont est normal F/u prn  Gross sideeffects, risk and benefits, and alternatives  of medications d/w patient. Patient is aware that all medications have potential sideeffects and we are unable to predict every sideeffect or drug-drug interaction that may occur.  nell Antuhao P , DO 10/24/2013 4:22 PM

## 2013-12-01 ENCOUNTER — Encounter (HOSPITAL_COMMUNITY): Payer: Self-pay | Admitting: Emergency Medicine

## 2013-12-01 ENCOUNTER — Emergency Department (INDEPENDENT_AMBULATORY_CARE_PROVIDER_SITE_OTHER)
Admission: EM | Admit: 2013-12-01 | Discharge: 2013-12-01 | Disposition: A | Discharge status: Home / Self Care | Payer: Self-pay | Source: Home / Self Care | Attending: Emergency Medicine | Admitting: Emergency Medicine

## 2013-12-01 DIAGNOSIS — G43909 Migraine, unspecified, not intractable, without status migrainosus: Secondary | ICD-10-CM

## 2013-12-01 LAB — POCT I-STAT, CHEM 8
BUN: 12 mg/dL (ref 6–23)
Calcium, Ion: 1.28 mmol/L — ABNORMAL HIGH (ref 1.12–1.23)
Chloride: 100 mEq/L (ref 96–112)
Creatinine, Ser: 0.9 mg/dL (ref 0.50–1.10)
GLUCOSE: 73 mg/dL (ref 70–99)
HCT: 44 % (ref 36.0–46.0)
HEMOGLOBIN: 15 g/dL (ref 12.0–15.0)
Potassium: 3.9 mEq/L (ref 3.7–5.3)
Sodium: 145 mEq/L (ref 137–147)
TCO2: 29 mmol/L (ref 0–100)

## 2013-12-01 MED ORDER — DICLOFENAC SODIUM 75 MG PO TBEC: DELAYED_RELEASE_TABLET | ORAL | Status: DC

## 2013-12-01 MED ORDER — METOCLOPRAMIDE HCL 10 MG PO TABS: ORAL_TABLET | ORAL | Status: DC

## 2013-12-01 MED ORDER — PROPRANOLOL HCL 20 MG PO TABS: 20.0000 mg | ORAL_TABLET | Freq: Two times a day (BID) | ORAL | Status: DC

## 2013-12-01 NOTE — ED Provider Notes (Signed)
Chief Complaint   Chief Complaint  Patient presents with  . Headache    History of Present Illness   Rachel Newton is a 48 year old female who presents with a 4 to six-week history of intermittent, throbbing headaches. These are localized to the entire top of the head. They're also sometimes localized to the left ear. She notes light sensitivity, sound sensitivity, and nausea with the headaches. They're also associated with some blurry vision and a sensation of a foreign body in the left eye. Her eyes hurt and sometimes she has double vision. She feels weak all over, but this is continuous and not necessarily with a headache. Her feet feel numb and sometimes hurt. She notes aching in her chest and her back which is better if she lies flat on her back. She denies any fever, chills, stiff neck, nasal congestion, rhinorrhea, sore throat, or adenopathy. She has had no difficulty speaking or walking she denies any trauma or use of anticoagulants. She's not pregnant. No one else at home has been sick. There's been no trauma to the neck. No jaw claudication.  Review of Systems   Other than as noted above, the patient denies any of the following symptoms: Systemic:  No fever, chills, photophobia, or stiff neck. Eye:  No blurred vision, or diplopia. ENT:  No nasal congestion, rhinorrhea, sinus pressure or pain, or sore throat.  No jaw claudication. Neuro:  No paresthesias, loss of consciousness, seizure activity, muscle weakness, trouble with coordination or gait, trouble speaking or swallowing. Psych:  No depression, anxiety or trouble sleeping.  PMFSH   Past medical history, family history, social history, meds, and allergies were reviewed.  She is using an herbal weight loss medication, but she started this after the headaches started, just in the past couple of weeks.  Physical Examination    Vital signs:  BP 142/86  Pulse 72  Temp(Src) 98.6 F (37 C) (Oral)  Resp 18  SpO2 98% General:   Alert and oriented.  In no distress. Eye:  Lids and conjunctivas normal.  PERRL,  Full EOMs.  Fundi benign with normal discs and vessels. There was no papilledema.  ENT:  No cranial or facial tenderness to palpation.  TMs and canals clear.  Nasal mucosa was normal and uncongested without any drainage. No intra oral lesions, pharynx clear, mucous membranes moist, dentition normal. Neck:  Supple, full ROM, no tenderness to palpation.  No adenopathy or mass. Neuro:  Alert and orented times 3.  Speech was clear, fluent, and appropriate.  Cranial nerves intact. No pronator drift, muscle strength normal. Finger to nose normal.  DTRs were 2+ and symmetrical.Station and gait were normal.  Romberg's sign was normal.  Able to perform tandem gait well. Psych:  Normal affect.   Labs   Results for orders placed during the hospital encounter of 12/01/13  POCT I-STAT, CHEM 8      Result Value Ref Range   Sodium 145  137 - 147 mEq/L   Potassium 3.9  3.7 - 5.3 mEq/L   Chloride 100  96 - 112 mEq/L   BUN 12  6 - 23 mg/dL   Creatinine, Ser 8.110.90  0.50 - 1.10 mg/dL   Glucose, Bld 73  70 - 99 mg/dL   Calcium, Ion 9.141.28 (*) 1.12 - 1.23 mmol/L   TCO2 29  0 - 100 mmol/L   Hemoglobin 15.0  12.0 - 15.0 g/dL   HCT 78.244.0  95.636.0 - 21.346.0 %   Assessment  The encounter diagnosis was Migraine headache.  There is no evidence of subarachnoid hemorrhage, meningitis, encephalitis, epidural hematoma, acute glaucoma, carotid dissection, temporal arteritis, cavernous sinus thrombosis, carbon monoxide toxicity, or pseudotumor cerebri.    Plan   1.  Meds:  The following meds were prescribed:   New Prescriptions   DICLOFENAC (VOLTAREN) 75 MG EC TABLET    Take 1 tab with food every 12 hours as needed for headache   METOCLOPRAMIDE (REGLAN) 10 MG TABLET    Take 1 tab every 12 hours as needed for headache   PROPRANOLOL (INDERAL) 20 MG TABLET    Take 1 tablet (20 mg total) by mouth 2 (two) times daily.    2.  Patient  Education/Counseling:  The patient was given appropriate handouts, self care instructions, and instructed in symptomatic relief.    3.  Follow up:  The patient was told to follow up here if no better in 2 to 3 days, or sooner if becoming worse in any way, and given some red flag symptoms such as fever, worsening pain, persistent vomiting, or new neurological symptoms which would prompt immediate return.  Followup with [facility] within the next month.     Reuben Likes, MD 12/01/13 [phone]

## 2013-12-01 NOTE — ED Notes (Signed)
Pt  Reports  Headache     With  Blurred  Vision   And  Weakness   X  1  Month          Ambulated    To     Room  With a  Slow  Steady  Gait        -  Pt          Daughter   Is  At  Bedside           To  Translate

## 2013-12-01 NOTE — Discharge Instructions (Signed)
Cefalea migrañosa  (Migraine Headache)  Una cefalea migrañosa es un dolor intenso y punzante en uno o ambos lados de la cabeza. La migraña puede durar desde 30 minutos hasta varias horas.  CAUSAS   No siempre se conoce la causa exacta de la cefalea migrañosa. Sin embargo, pueden aparecer cuando los nervios del cerebro se irritan y liberan ciertas sustancias químicas que causan inflamación. Esto ocasiona dolor.  Existen también ciertos factores que pueden desencadenar las migrañas, como los siguientes:  · Alcohol.  · Fumar.  · Estrés.  · La menstruación  · Quesos añejados.  · Los alimentos o las bebidas que contienen nitratos, glutamato, aspartamo o tiramina.  · Falta de sueño.  · Chocolate.  · Cafeína.  · Hambre.  · Actividad física extenuante.  · Fatiga.  · Medicamentos que se usan para tratar el dolor en el pecho (nitroglicerina), píldoras anticonceptivas, estrógeno y algunos medicamentos para la hipertensión arterial.  SIGNOS Y SÍNTOMAS  · Dolor en uno o ambos lados de la cabeza.  · Dolor pulsante o punzante.  · Dolor intenso que impide realizar las actividades diarias.  · Dolor que se agrava por cualquier actividad física.  · Náuseas, vómitos o ambos.  · Mareos.  · Dolor con la exposición a las luces brillantes, a los ruidos fuertes o la actividad.  · Sensibilidad general a las luces brillantes, a los ruidos fuertes o a los olores.  Antes de sufrir una migraña, puede recibir señales de advertencia (aura). Un aura puede incluir:  · Ver las luces intermitentes.  · Ver puntos brillantes, halos o líneas en zigzag.  · Tener una visión en túnel o visión borrosa.  · Sensación de entumecimiento u hormigueo.  · Dificultad para hablar  · Debilidad muscular.  DIAGNÓSTICO   La cefalea migrañosa se diagnostica en función de lo siguiente:  · Síntomas.  · Examen físico.  · Una TC (tomografía computada) o resonancia magnética de la cabeza. Estas pruebas de diagnóstico por imagen no pueden diagnosticar las migrañas, pero pueden  ayudar a descartar otras causas de las cefaleas.  TRATAMIENTO  Le prescribirán medicamentos para aliviar el dolor y las náuseas. También podrán administrarse medicamentos para ayudar a prevenir las migrañas recurrentes.   INSTRUCCIONES PARA EL CUIDADO EN EL HOGAR  · Sólo tome medicamentos de venta libre o recetados para calmar el dolor o el malestar, según las indicaciones de su médico. No se recomienda usar los opiáceos a largo plazo.  · Cuando tenga la migraña, acuéstese en un cuarto oscuro y tranquilo  · Lleve un registro diario para averiguar lo que puede provocar las cefaleas migrañosas. Por ejemplo, escriba:  · Lo que usted come y bebe.  · Cuánto tiempo duerme.  · Algún cambio en su dieta o en los medicamentos.  · Limite el consumo de bebidas alcohólicas.  · Si fuma, deje de hacerlo.  · Duerma entre 7 y 9 horas, o según las recomendaciones del médico.  · Limite el estrés.  · Mantenga las luces tenues si le molestan las luces brillantes y la migraña empeora.  SOLICITE ATENCIÓN MÉDICA DE INMEDIATO SI:   · La migraña se hace cada vez más intensa.  · Tiene fiebre.  · Presenta rigidez en el cuello.  · Tiene pérdida de visión.  · Presenta debilidad muscular o pérdida del control muscular.  · Comienza a perder el equilibrio o tiene problemas para caminar.  · Sufre mareos o se desmaya.  · Tiene síntomas graves que son diferentes a los primeros   síntomas.  ASEGÚRESE DE QUE:   · Comprende estas instrucciones.  · Controlará su afección.  · Recibirá ayuda de inmediato si no mejora o si empeora.  Document Released: 06/16/2005 Document Revised: 04/06/2013  Exit[employer]® Patient Information ©2014 [employer].

## 2014-03-02 ENCOUNTER — Ambulatory Visit: Payer: Self-pay | Attending: Family Medicine | Admitting: Family Medicine

## 2014-03-02 ENCOUNTER — Encounter: Payer: Self-pay | Admitting: Family Medicine

## 2014-03-02 ENCOUNTER — Other Ambulatory Visit (HOSPITAL_COMMUNITY): Admission: RE | Admit: 2014-03-02 | Payer: Self-pay | Source: Ambulatory Visit | Admitting: Family Medicine

## 2014-03-02 VITALS — BP 127/72 | HR 70 | Temp 98.0°F | Resp 16 | Ht 60.0 in | Wt 182.2 lb

## 2014-03-02 DIAGNOSIS — M25529 Pain in unspecified elbow: Secondary | ICD-10-CM

## 2014-03-02 DIAGNOSIS — M2550 Pain in unspecified joint: Secondary | ICD-10-CM | POA: Insufficient documentation

## 2014-03-02 DIAGNOSIS — M25561 Pain in right knee: Secondary | ICD-10-CM

## 2014-03-02 DIAGNOSIS — M25562 Pain in left knee: Secondary | ICD-10-CM

## 2014-03-02 DIAGNOSIS — R102 Pelvic and perineal pain unspecified side: Secondary | ICD-10-CM | POA: Insufficient documentation

## 2014-03-02 DIAGNOSIS — B029 Zoster without complications: Secondary | ICD-10-CM

## 2014-03-02 DIAGNOSIS — Z23 Encounter for immunization: Secondary | ICD-10-CM

## 2014-03-02 DIAGNOSIS — G8929 Other chronic pain: Secondary | ICD-10-CM

## 2014-03-02 DIAGNOSIS — N949 Unspecified condition associated with female genital organs and menstrual cycle: Secondary | ICD-10-CM | POA: Insufficient documentation

## 2014-03-02 DIAGNOSIS — M25521 Pain in right elbow: Secondary | ICD-10-CM | POA: Insufficient documentation

## 2014-03-02 DIAGNOSIS — Z124 Encounter for screening for malignant neoplasm of cervix: Secondary | ICD-10-CM

## 2014-03-02 DIAGNOSIS — M25569 Pain in unspecified knee: Secondary | ICD-10-CM

## 2014-03-02 DIAGNOSIS — M25522 Pain in left elbow: Secondary | ICD-10-CM | POA: Insufficient documentation

## 2014-03-02 DIAGNOSIS — Z113 Encounter for screening for infections with a predominantly sexual mode of transmission: Secondary | ICD-10-CM

## 2014-03-02 LAB — POCT URINALYSIS DIPSTICK
BILIRUBIN UA: NEGATIVE
GLUCOSE UA: NEGATIVE
KETONES UA: NEGATIVE
Nitrite, UA: NEGATIVE
PH UA: 7
Protein, UA: NEGATIVE
Spec Grav, UA: 1.015
Urobilinogen, UA: 0.2

## 2014-03-02 LAB — TSH: TSH: 1.442 u[IU]/mL (ref 0.350–4.500)

## 2014-03-02 NOTE — Assessment & Plan Note (Signed)
Pap done today, last pap in 2012 normal

## 2014-03-02 NOTE — Assessment & Plan Note (Signed)
Screening HIV  

## 2014-03-02 NOTE — Assessment & Plan Note (Signed)
A: pain w/o swelling, rash, red flags to suggest autoimmune symptoms P: TSH, Vit D level Low impact exercise Weight loss  Antiinflammatory: NSAID with food or tart cherry juice

## 2014-03-02 NOTE — Progress Notes (Signed)
Establish care Annual physical with PAP Smear

## 2014-03-02 NOTE — Progress Notes (Signed)
Subjective:    Patient ID: Rachel Newton, female    DOB: [birth year], 48 y.o.   MRN: [id number] CC: establish care, physical, burning sensation and pain in pelvic area, joint pain in elbows and wrist w/o swelling  HPI A 48 year old female presents to establish care and for a health maintenance physical.  She also has the following concerns which will be addressed folia her in followup visit: Joint pain in her elbows and wrist without swelling, burning sensation and pelvic pain.  1. Pelvic pain: x one month. Vaginal area and suprapubic. Non radiating. No fever. No discharge. Nausea sometimes when drinking. No vomiting. Pain is worse with sex.  2. Joint pains: chronic x 1 year. No swelling, or redness, no injury. Worse in knees when standing from sitting and exercise. Associated with crepitus. No treatment to date.   Soc Hx: chronic non smoker  Review of Systems Review of Symptoms  General:  Negative for unexplained weight loss, fever Skin: Negative for new or changing mole, sore that won't heal HEENT: Positive for trouble hearing in R ear, positive trouble seeing both eyes, ringing in ears, mouth sores, hoarseness, change in voice, dysphagia. CV:  Negative for chest pain, dyspnea, edema, palpitations Resp: Negative for cough, dyspnea, hemoptysis GI:  Positive for nausea. Negative for vomiting, diarrhea, constipation, abdominal pain, melena, hematochezia. GU: Positive for pain with intercourse, pelvic pain. Negative for dysuria, incontinence, urinary hesitance, hematuria, vaginal or penile discharge, polyuria,  lumps in testicle or breasts MSK: Positive for joint pain and crepitus in both knees and elbows. No swelling. Negative for muscle cramps or aches,  Neuro: Negative for headaches, weakness, numbness, dizziness, passing out/fainting Psych: Negative for depression, anxiety, memory problems     Objective:   Physical Exam BP 127/72  Pulse 70  Temp(Src) 98 F (36.7 C) (Oral)   Resp 16  Ht 5' (1.524 m)  Wt 182 lb 3.2 oz (82.645 kg)  BMI 35.58 kg/m2  SpO2 98%  General Appearance:    Alert, cooperative, no distress, appears stated age  Head:    Normocephalic, without obvious abnormality, atraumatic  Eyes:    PERRL, conjunctiva/corneas clear, EOM's intact,  both eyes  Ears:    Normal TM's and external ear canals, both ears  Nose:   Nares normal, septum midline, mucosa normal, no drainage    or sinus tenderness  Throat:   Lips, mucosa, and tongue normal; teeth and gums normal  Neck:   Supple, symmetrical, trachea midline, no adenopathy;    thyroid:  no enlargement/tenderness/nodules; no carotid   bruit or JVD  Back:     Symmetric, no curvature, ROM normal, no CVA tenderness  Lungs:     Clear to auscultation bilaterally, respirations unlabored  Chest Wall:    No tenderness or deformity   Heart:    Regular rate and rhythm, S1 and S2 normal, no murmur, rub   or gallop  Breast Exam:    Not done   Abdomen:     Soft, non-tender, bowel sounds active all four quadrants,    no masses, no organomegaly  Genitalia:    Normal external genitalia, normal vagina. Cervix is friable and inflamed centrally. Mild CMT. Mild fundal uterine tenderness. No fundal mass. No adnexal tenderness.   Rectal:    Deferred   Extremities:   Extremities normal, atraumatic, no cyanosis or edema  Pulses:   2+ and symmetric all extremities  Skin:   Skin color, texture, turgor normal, no rashes or lesions  Lymph  nodes:   Cervical, supraclavicular, and axillary nodes normal  Neurologic:   CNII-XII intact, normal strength, sensation and reflexes    throughout      Assessment & Plan:

## 2014-03-02 NOTE — Patient Instructions (Addendum)
Senora Cordova,  Gracias por venido hoy. Thank you for coming in today. It was a pleasure meeting you. I look forward to be a primary doctor.  Regading pelvic pain. You have some inflammation on your cervix. You will be called with pap results, cervical culture and urine study results.. If all normal, we will obtain a f/u ultrasound.   Regarding joint pain,I see no signs of inflammatory arthritis or autoimmune disease. I will check TSH and vit d level. I recommend trial of antiinflammatory like ibuprofen 600 mg with food, weight loss, low impact exercise like elliptical and water aerobics, tart cherry juice is an natural inflammatory. If needed please f/u with me specially for this problem and we can evaluate further and likely order x-rays.    For mammogram: Please call Stoney Bang, [phone],  with the BCCCP (breast and cervical cancer control program) at the [facility] to set up an appointment to verify eligibility for a breast exam, mammogram, ultrasound. If you qualify this will be set up at [facility].  See me in 4-6 weeks as needed for persistent joint pain.  Dr. Armen Pickup

## 2014-03-02 NOTE — Assessment & Plan Note (Signed)
A: exam concerning for cervicitis P: Wet prep GC/chlam Pap UA/UCx. UA wnl If all normal, will obtain f/u pelvic US

## 2014-03-02 NOTE — Assessment & Plan Note (Signed)
A: pain w/o swelling, rash, red flags to suggest autoimmune symptoms. Suspect overuse injury since patient works on [employer].  P: TSH, Vit D level Antiinflammatory: NSAID with food or tart cherry juice

## 2014-03-03 LAB — HIV ANTIBODY (ROUTINE TESTING W REFLEX): HIV 1&2 Ab, 4th Generation: NONREACTIVE

## 2014-03-03 LAB — VITAMIN D 25 HYDROXY (VIT D DEFICIENCY, FRACTURES): VIT D 25 HYDROXY: 30 ng/mL (ref 30–89)

## 2014-03-04 LAB — URINE CULTURE
Colony Count: NO GROWTH
Organism ID, Bacteria: NO GROWTH

## 2014-03-07 LAB — CYTOLOGY - PAP

## 2014-03-09 ENCOUNTER — Telehealth: Payer: Self-pay | Admitting: *Deleted

## 2014-03-09 NOTE — Telephone Encounter (Signed)
Message copied by Dyann Kief on Thu Mar 09, 2014  4:06 PM ------      Message from: Dessa Phi      Created: Thu Mar 09, 2014  9:02 AM       Screening HIV negative      TSH. Vit D normal      Wet prep negative      GC/chlam negative      Pap negative ------

## 2014-03-09 NOTE — Telephone Encounter (Signed)
Pt aware of lab results 

## 2014-03-21 ENCOUNTER — Other Ambulatory Visit: Payer: Self-pay | Admitting: Obstetrics and Gynecology

## 2014-03-21 DIAGNOSIS — Z1231 Encounter for screening mammogram for malignant neoplasm of breast: Secondary | ICD-10-CM

## 2014-04-03 ENCOUNTER — Encounter: Payer: Self-pay | Admitting: Family Medicine

## 2014-04-03 ENCOUNTER — Ambulatory Visit: Payer: Self-pay | Attending: Family Medicine | Admitting: Family Medicine

## 2014-04-03 VITALS — BP 115/73 | HR 62 | Temp 97.9°F | Resp 16 | Ht 60.0 in | Wt 184.0 lb

## 2014-04-03 DIAGNOSIS — E7800 Pure hypercholesterolemia, unspecified: Secondary | ICD-10-CM

## 2014-04-03 DIAGNOSIS — M25529 Pain in unspecified elbow: Secondary | ICD-10-CM

## 2014-04-03 DIAGNOSIS — Z124 Encounter for screening for malignant neoplasm of cervix: Secondary | ICD-10-CM | POA: Insufficient documentation

## 2014-04-03 DIAGNOSIS — M25561 Pain in right knee: Secondary | ICD-10-CM | POA: Insufficient documentation

## 2014-04-03 DIAGNOSIS — Z114 Encounter for screening for human immunodeficiency virus [HIV]: Secondary | ICD-10-CM | POA: Insufficient documentation

## 2014-04-03 DIAGNOSIS — M25562 Pain in left knee: Secondary | ICD-10-CM | POA: Insufficient documentation

## 2014-04-03 DIAGNOSIS — G8929 Other chronic pain: Secondary | ICD-10-CM

## 2014-04-03 DIAGNOSIS — Z113 Encounter for screening for infections with a predominantly sexual mode of transmission: Secondary | ICD-10-CM

## 2014-04-03 MED ORDER — MELOXICAM 15 MG PO TABS: 15.0000 mg | ORAL_TABLET | Freq: Every day | ORAL | Status: DC

## 2014-04-03 NOTE — Progress Notes (Signed)
F/U Join pain, still with pain  Pt stated has drainage on both eyes with blurry vision

## 2014-04-03 NOTE — Patient Instructions (Signed)
Mrs. Rachel Newton,  Thank you for coming in today. Please start mobic once daily with food. Exercise regularly.   Return on Thursday for lab work. You will be called with results.  Dr. Armen PickupFunches

## 2014-04-03 NOTE — Assessment & Plan Note (Signed)
Normal pap

## 2014-04-03 NOTE — Assessment & Plan Note (Signed)
Screening HIV negative  

## 2014-04-03 NOTE — Progress Notes (Signed)
   Subjective:    Patient ID: Rachel Newton, female    DOB: 1965/10/17, 48 y.o.   MRN: [id number] CC: f/u joint pain  HPI 48 yo Hispanic F presents for f/u visit:  1. Joint pain: both knees, both elbows. Sometimes with soreness in b/l upper shoulders. No joint swelling. No oral NSAID. Has not missed work. No rash.    2. Elevated cholesterol in past: no on therapy.   Soc h: non smoker  Review of Systems As per HPI     Objective:   Physical Exam BP 115/73  Pulse 62  Temp(Src) 97.9 F (36.6 C) (Oral)  Resp 16  Ht 5' (1.524 m)  Wt 184 lb (83.462 kg)  BMI 35.94 kg/m2  SpO2 97% General appearance: alert, cooperative and no distress Extremities: extremities normal, atraumatic, no cyanosis or edema       Assessment & Plan:

## 2014-04-03 NOTE — Assessment & Plan Note (Signed)
A: pain w/o swelling, rash, red flags to suggest autoimmune symptoms P: Low impact exercise Weight loss  mobiC CK and sed rate to r/o Polymyalgia rheumatica

## 2014-04-03 NOTE — Assessment & Plan Note (Signed)
Repeat lipids

## 2014-04-06 ENCOUNTER — Ambulatory Visit: Payer: Self-pay | Attending: Family Medicine

## 2014-04-06 ENCOUNTER — Telehealth: Payer: Self-pay | Admitting: *Deleted

## 2014-04-06 DIAGNOSIS — E7800 Pure hypercholesterolemia, unspecified: Secondary | ICD-10-CM

## 2014-04-06 LAB — LIPID PANEL
CHOLESTEROL: 182 mg/dL (ref 0–200)
HDL: 46 mg/dL (ref >39)
LDL Cholesterol: 108 mg/dL — ABNORMAL HIGH (ref 0–99)
TRIGLYCERIDES: 141 mg/dL (ref <150)
Total CHOL/HDL Ratio: 4 Ratio
VLDL: 28 mg/dL (ref 0–40)

## 2014-04-06 NOTE — Telephone Encounter (Signed)
Pt stated Meloxican not helping still has leg pain

## 2014-04-10 NOTE — Telephone Encounter (Signed)
Please have patient add tylenol 1000 mg three times daily to treat knee pain

## 2014-04-12 ENCOUNTER — Telehealth: Payer: Self-pay | Admitting: *Deleted

## 2014-04-18 ENCOUNTER — Ambulatory Visit (HOSPITAL_COMMUNITY)
Admission: RE | Admit: 2014-04-18 | Discharge: 2014-04-18 | Disposition: A | Discharge status: Home / Self Care | Payer: Self-pay | Source: Ambulatory Visit | Attending: Obstetrics and Gynecology | Admitting: Obstetrics and Gynecology

## 2014-04-18 ENCOUNTER — Encounter (HOSPITAL_COMMUNITY): Payer: Self-pay

## 2014-04-18 VITALS — BP 110/64 | Temp 98.1°F | Ht 60.0 in | Wt 184.0 lb

## 2014-04-18 DIAGNOSIS — Z1231 Encounter for screening mammogram for malignant neoplasm of breast: Secondary | ICD-10-CM

## 2014-04-18 DIAGNOSIS — Z1239 Encounter for other screening for malignant neoplasm of breast: Secondary | ICD-10-CM

## 2014-04-18 HISTORY — DX: Hyperlipidemia, unspecified: E78.5

## 2014-04-18 NOTE — Addendum Note (Signed)
Encounter addended by: Priscille Heidelberghristine P Faten Frieson, RN on: 04/18/2014  3:08 PM<BR>     Documentation filed: Visit Diagnoses, Notes Section

## 2014-04-18 NOTE — Patient Instructions (Signed)
Explained to [name] that BCCCP will cover Pap smears and co-testing every 5 years unless has a history of abnormal Pap smears. Let [facility] know the [facility] will [facility] up with her within the next couple weeks with results of mammogram by letter or phone. [name] verbalized understanding. Patient escorted to mammography for a screening mammogram.

## 2014-04-18 NOTE — Progress Notes (Addendum)
No complaints today.  Pap Smear:  Pap smear not completed today. [facility] and normal with negative HPV typing. Per patient has a history of an abnormal Pap smear 4-5 years ago with a repeat Pap smear that was normal. Per patient has had three normal Pap smears since abnormal Pap smear. Last Pap smear result is in EPIC.  Physical exam: Breasts Breasts symmetrical. No skin abnormalities bilateral breasts. No nipple retraction bilateral breasts. No nipple discharge bilateral breasts. No lymphadenopathy. No lumps palpated bilateral breasts. No complaints of pain or tenderness on exam. Patient escorted to mammography for a screening mammogram.        Pelvic/Bimanual No Pap smear completed today since last Pap smear was 03/02/2014. Pap smear not indicated per BCCCP guidelines.   Nira ConnJulia Sowell interpreter employee [facility] interpreted for patient.

## 2014-05-01 ENCOUNTER — Encounter (HOSPITAL_COMMUNITY): Payer: Self-pay

## 2014-05-15 ENCOUNTER — Ambulatory Visit: Payer: Self-pay

## 2014-08-03 ENCOUNTER — Other Ambulatory Visit: Payer: Self-pay

## 2014-08-03 ENCOUNTER — Telehealth: Payer: Self-pay

## 2014-08-03 ENCOUNTER — Ambulatory Visit (HOSPITAL_BASED_OUTPATIENT_CLINIC_OR_DEPARTMENT_OTHER): Payer: Self-pay

## 2014-08-03 VITALS — BP 100/78 | HR 64 | Temp 98.3°F | Resp 12 | Ht 59.0 in | Wt 184.7 lb

## 2014-08-03 DIAGNOSIS — Z0000 Encounter for general adult medical examination without abnormal findings: Secondary | ICD-10-CM

## 2014-08-03 LAB — LIPID PANEL
Cholesterol: 172 mg/dL (ref 0–200)
HDL: 43 mg/dL (ref >39)
LDL Cholesterol: 99 mg/dL (ref 0–99)
TRIGLYCERIDES: 152 mg/dL — AB (ref <150)
Total CHOL/HDL Ratio: 4 Ratio
VLDL: 30 mg/dL (ref 0–40)

## 2014-08-03 LAB — HEMOGLOBIN A1C
Hgb A1c MFr Bld: 5.9 % — ABNORMAL HIGH (ref <5.7)
Mean Plasma Glucose: 123 mg/dL — ABNORMAL HIGH (ref <117)

## 2014-08-03 LAB — GLUCOSE (CC13): Glucose: 96 mg/dl (ref 70–140)

## 2014-08-03 NOTE — Telephone Encounter (Signed)
Pt states she missed her call. Please CB# [phone]

## 2014-08-03 NOTE — Patient Instructions (Signed)
Discussed health assessment with patient. Will decide on programs she wants when call lab results. Let patient know that will call her on Friday with lab results and patient asked to be called on Monday. Patient verbalized understanding.

## 2014-08-03 NOTE — Progress Notes (Signed)
Patient [facility] and is currently a [facility] patient effective 04/18/2014 and is with interpreter from [facility].   Clinical Measurements: Patient is 4 ft. 11 inches, weight 184.7 lbs, waist circumference 40.5 inches, and hip circumference 45 inches.   Medical History: Patient states has no history of diabetes.Patient states does have a history of hypertension and high cholesterol. Per patient states no diagnosed history of coronary heart disease, heart attack, heart failure, stroke/TIA, vascular disease or congenital heart defects. Patient stated that doctor has not prescribed anything for cholesterol or blood pressure.  Blood Pressure, Self-measurement: Patient states that does not have equipment to check blood pressure and has not been told to check blood pressure.   Nutrition Assessment: Patient stated that eats 2 fruits some days and none on other days. Patient states she eats one 4 serving of vegetables a day. Per patient eats 3 or more ounces of whole grains daily. Patient doesn't eat two or more servings of fish weekly. Patient states she does not drink more than 36 ounces or 450 calories of beverages with added sugars weekly. Patient stated she does watch her salt intake.   Physical Activity Assessment: Patient states that walks walks a lot on job and household chores for around 2130 minutes per week and no vigorous activity.  Smoking Status: Patient had never smoked and is not around smoke.  Quality of Life Assessment: In assessing patient's quality of life she stated that out of the past 30 days that she has felt her Physical health was not good all 30 days.  Patient states has a lot of joint pain and headaches. Patient also stated that in the past 30 days that her mental health is not good including stress, depression and problems with emotions for 8 days. Patient did state that out of the past 30 days she felt her physical or mental health had not kept her from  doing her usual activities including self-care, work or recreation.   Plan: Lab work will be done today including a lipid panel, blood glucose, and Hgb A1C. Will call lab results when they are finished. Will discuss health coaching when call lab results.

## 2014-08-08 ENCOUNTER — Telehealth: Payer: Self-pay

## 2014-08-08 NOTE — Telephone Encounter (Signed)
Plan[facility] 10:15. Will give all labs and go over on the 11th.

## 2014-08-10 ENCOUNTER — Ambulatory Visit: Payer: Self-pay

## 2014-08-10 VITALS — Wt 184.7 lb

## 2014-08-10 DIAGNOSIS — Z789 Other specified health status: Secondary | ICD-10-CM

## 2014-08-10 NOTE — Progress Notes (Signed)
Patient returns today for Health Coaching regarding Nutrition with sister in law for her borderline AIC and activity with interpreter(maribel).   NUTRITION: Patient and I went over Know Your Health Numbers again.Patient reviewed A1C handout to see about prediabetes, normal and  diabetes range. Patient went over what prediabetes is, complications that can result if do not get levels to normal, and stay at diabetes level. Discussed that needed to lose at least 10% of body weight (18 lbs).Discussed increasing fiber in diet, reading labels, increasing activity and serving sizes.Explained to patient and showed her that she is overweight and has BMI of 37.4.  Discussed watching carbohydrates, how to count them, serving sizes and number of carbs per day allowance. Used plastic serving sizes to see portion sizes and what were starches, fruit and vegetables.Patient reviewed weight /activity chart. Patient went over 1,500 cal diet and we broke it down to the number of servings she could have. Patient received and reviewed the following handouts in Spanish: Carb counting Menu, prediabetes, A1C Exam, Your Game Plan to Prevent Type 2 Diabetes, 1500 calorie meal plan, Serving Portions, and Carb Counting and meal planning. Gave patient measuring cup to measure serving sizes and demonstrated the serving sizes. Also, received cookbook, water bottle, and pill box.  ACTIVITY: Discussed activity and walking Patient discussed getting gym membership. We discussed different options available in community.Patient received pedometer and was explained and shown how it works. Told to try and walk 3 days a week for 7,500 to 10,000 steps Received and reviewed [facility] Activity book in Spanish.  PLAN: Pursue orange card renewal. Increasing amount of walking per day and add other exercises to plan, Decrease carbohydrates in diet. Lose weight. Text [facility] address. Will return in 3 months to check A1C. Get appointment at  [id number].

## 2014-08-10 NOTE — Patient Instructions (Signed)
Patient will follow 1500 calorie diet plan. Will review all handouts and exercise/activity book. Will increase fiber in diet. Will decrease Carbohydrates to less than 200 per day. Will increase exercise and use pedometer. Will measure portion sizes. Will call if has any questions. Will call patient in three weeks. Patient verbalized understanding.

## 2014-08-22 NOTE — Telephone Encounter (Signed)
error 

## 2014-11-06 ENCOUNTER — Telehealth: Payer: Self-pay

## 2014-11-06 NOTE — Telephone Encounter (Signed)
Called per Rachel RoyalsJulie Newton (Hispanic Interpreter) for [facility] regarding Health Coaching. Patient stated that was good.Patient stated that had gone to the gym for awhile and lost 10 pounds but then went back to eating like she use to. Stressed the importance of exercise, reducing carbohydrates and not eating sweets.

## 2014-11-14 ENCOUNTER — Encounter (HOSPITAL_COMMUNITY): Payer: Self-pay | Admitting: Emergency Medicine

## 2014-11-14 ENCOUNTER — Emergency Department (HOSPITAL_COMMUNITY)
Admission: EM | Admit: 2014-11-14 | Discharge: 2014-11-14 | Disposition: A | Discharge status: Home / Self Care | Payer: 59 | Source: Home / Self Care | Attending: Family Medicine | Admitting: Family Medicine

## 2014-11-14 DIAGNOSIS — N390 Urinary tract infection, site not specified: Secondary | ICD-10-CM | POA: Diagnosis not present

## 2014-11-14 LAB — POCT URINALYSIS DIP (DEVICE)
Bilirubin Urine: NEGATIVE
Glucose, UA: NEGATIVE mg/dL
Ketones, ur: NEGATIVE mg/dL
Nitrite: NEGATIVE
PROTEIN: NEGATIVE mg/dL
Specific Gravity, Urine: 1.015 (ref 1.005–1.030)
Urobilinogen, UA: 0.2 mg/dL (ref 0.0–1.0)
pH: 6 (ref 5.0–8.0)

## 2014-11-14 LAB — POCT PREGNANCY, URINE: PREG TEST UR: NEGATIVE

## 2014-11-14 MED ORDER — NITROFURANTOIN MONOHYD MACRO 100 MG PO CAPS: 100.0000 mg | ORAL_CAPSULE | Freq: Two times a day (BID) | ORAL | Status: DC

## 2014-11-14 NOTE — ED Notes (Signed)
C/o UTI sx onset 5/15 Sx include hematuria, dysuria, abd/back pain, chills and urinary freq Denies fevers Alert, no signs of acute distress.

## 2014-11-14 NOTE — Discharge Instructions (Signed)
Infección urinaria  °(Urinary Tract Infection) ° La infección urinaria puede ocurrir en cualquier lugar del tracto urinario. El tracto urinario es un sistema de drenaje del cuerpo por el que se eliminan los desechos y el exceso de agua. El tracto urinario está formado por dos riñones, dos uréteres, la vejiga y la uretra. Los riñones son órganos que tienen forma de frijol. Cada riñón tiene aproximadamente el tamaño del puño. Están situados debajo de las costillas, uno a cada lado de la columna vertebral °CAUSAS  °La causa de la infección son los microbios, que son organismos microscópicos, que incluyen hongos, virus, y bacterias. Estos organismos son tan pequeños que sólo pueden verse a través del microscopio. Las bacterias son los microorganismos que más comúnmente causan infecciones urinarias.  °SÍNTOMAS  °Los síntomas pueden variar según la edad y el sexo del paciente y por la ubicación de la infección. Los síntomas en las mujeres jóvenes incluyen la necesidad frecuente e intensa de orinar y una sensación dolorosa de ardor en la vejiga o en la uretra durante la micción. Las mujeres y los hombres mayores podrán sentir cansancio, temblores y debilidad y sentir dolores musculares y dolor abdominal. Si tiene fiebre, puede significar que la infección está en los riñones. Otros síntomas son dolor en la espalda o en los lados debajo de las costillas, náuseas y vómitos.  °DIAGNÓSTICO  °Para diagnosticar una infección urinaria, el médico le preguntará acerca de sus síntomas. También le solicitará una muestra de orina. La muestra de orina se analiza para detectar bacterias y glóbulos blancos de la sangre. Los glóbulos blancos se forman en el organismo para ayudar a combatir las infecciones.  °TRATAMIENTO  °Por lo general, las infecciones urinarias pueden tratarse con medicamentos. Debido a que la mayoría de las infecciones son causadas por bacterias, por lo general pueden tratarse con antibióticos. La elección del  antibiótico y la duración del tratamiento dependerá de sus síntomas y el tipo de bacteria causante de la infección.  °INSTRUCCIONES PARA EL CUIDADO EN EL HOGAR  °· Si le recetaron antibióticos, tómelos exactamente como su médico le indique. Termine el medicamento aunque se sienta mejor después de haber tomado sólo algunos. °· Beba gran cantidad de líquido para mantener la orina de tono claro o color amarillo pálido. °· Evite la cafeína, el té y las bebidas gaseosas. Estas sustancias irritan la vejiga. °· Vaciar la vejiga con frecuencia. Evite retener la orina durante largos períodos. °· Vacíe la vejiga antes y después de tener relaciones sexuales. °· Después de mover el intestino, las mujeres deben higienizarse la región perineal desde adelante hacia atrás. Use sólo un papel tissue por vez. °SOLICITE ATENCIÓN MÉDICA SI:  °· Siente dolor en la espalda. °· Le sube la fiebre. °· Los síntomas no mejoran luego de 3 días. °SOLICITE ATENCIÓN MÉDICA DE INMEDIATO SI:  °· Siente dolor intenso en la espalda o en la zona inferior del abdomen. °· Comienza a sentir escalofríos. °· Tiene náuseas o vómitos. °· Tiene una sensación continua de quemazón o molestias al orinar. °ASEGÚRESE DE QUE:  °· Comprende estas instrucciones. °· Controlará su enfermedad. °· Solicitará ayuda de inmediato si no mejora o empeora. °Document Released: 03/26/2005 Document Revised: 03/10/2012 °ExitCare® Patient Information ©2015 Exit[employer], LLC. This information is not intended to replace advice given to you by your health care provider. Make sure you discuss any questions you have with your health care provider. ° °

## 2014-11-14 NOTE — ED Provider Notes (Signed)
CSN: [id number]     Arrival date & time 11/14/14  1445 History   First MD Initiated Contact with Patient 11/14/14 1524     Chief Complaint  Patient presents with  . Urinary Tract Infection   (Consider location/radiation/quality/duration/timing/severity/associated sxs/prior Treatment) HPI Comments: Dysuria, hematuria and urinary frequency since 11/12/2014.   Patient is a 49 y.o. female presenting with urinary tract infection. The history is provided by the patient. The history is limited by a language barrier. A language interpreter was used.  Urinary Tract Infection This is a new problem.    Past Medical History  Diagnosis Date  . Allergy 2001  . Migraine 2015    previously treated with propranolol   . Shingles 07/07/2012  . Hyperlipidemia    Past Surgical History  Procedure Laterality Date  . Tubal ligation  1997    Family History  Problem Relation Age of Onset  . Hypertension Mother   . Cancer Neg Hx   . Heart disease Neg Hx    History  Substance Use Topics  . Smoking status: Never Smoker   . Smokeless tobacco: Never Used  . Alcohol Use: No   OB History    Gravida Para Term Preterm AB TAB SAB Ectopic Multiple Living   3 3 3       3      Review of Systems  All other systems reviewed and are negative.   Allergies  Review of patient's allergies indicates no known allergies.  Home Medications   Prior to Admission medications   Medication Sig Start Date End Date Taking? Authorizing Provider  meloxicam (MOBIC) 15 MG tablet Take 1 tablet (15 mg total) by mouth daily. 04/03/14   Josalyn Funches, MD  nitrofurantoin, macrocrystal-monohydrate, (MACROBID) 100 MG capsule Take 1 capsule (100 mg total) by mouth 2 (two) times daily. 11/14/14   Jess BartersJennifer Lee H Diona Peregoy, PA   BP 123/73 mmHg  Pulse 62  Temp(Src) 99.1 F (37.3 C) (Oral)  Resp 20  SpO2 99% Physical Exam  Constitutional: She is oriented to person, place, and time. She appears well-developed and well-nourished. No  distress.  HENT:  Head: Normocephalic and atraumatic.  Eyes: Conjunctivae are normal.  Cardiovascular: Normal rate, regular rhythm and normal heart sounds.   Pulmonary/Chest: Effort normal and breath sounds normal.  Abdominal: Soft. Normal appearance and bowel sounds are normal. She exhibits no distension. There is no tenderness. There is no CVA tenderness.  Musculoskeletal: Normal range of motion.  Neurological: She is alert and oriented to person, place, and time.  Skin: Skin is warm and dry.  Psychiatric: She has a normal mood and affect. Her behavior is normal.  Nursing note and vitals reviewed.   ED Course  Procedures (including critical care time) Labs Review Labs Reviewed  POCT URINALYSIS DIP (DEVICE) - Abnormal; Notable for the following:    Hgb urine dipstick MODERATE (*)    Leukocytes, UA SMALL (*)    All other components within normal limits  URINE CULTURE  POCT PREGNANCY, URINE    Imaging Review No results found.   MDM   1. UTI (lower urinary tract infection)    Macrobid as directed    Ria ClockJennifer Lee H Sueko Dimichele, GeorgiaPA 11/14/14 [phone]

## 2014-12-21 ENCOUNTER — Telehealth: Payer: Self-pay

## 2014-12-21 NOTE — Telephone Encounter (Signed)
Called per interpreter Maretta Los

## 2014-12-22 NOTE — Telephone Encounter (Signed)
Called per interpreter Maretta Los. Patient stated that was back to decreasing carbohydrates and is still exercising. Discussed steps that patient is taking to decrease A1C and Triglycerides. Will repreat Hbg A1C on August 11 at 8:30 AM. (15 min.)

## 2015-03-06 ENCOUNTER — Other Ambulatory Visit: Payer: Self-pay | Admitting: Obstetrics and Gynecology

## 2015-03-06 ENCOUNTER — Other Ambulatory Visit (HOSPITAL_COMMUNITY)
Admission: RE | Admit: 2015-03-06 | Discharge: 2015-03-06 | Disposition: A | Discharge status: Home / Self Care | Payer: 59 | Source: Ambulatory Visit | Attending: Obstetrics and Gynecology | Admitting: Obstetrics and Gynecology

## 2015-03-06 DIAGNOSIS — Z01419 Encounter for gynecological examination (general) (routine) without abnormal findings: Secondary | ICD-10-CM | POA: Insufficient documentation

## 2015-03-07 LAB — CYTOLOGY - PAP

## 2015-04-28 ENCOUNTER — Encounter (HOSPITAL_COMMUNITY): Payer: Self-pay | Admitting: Emergency Medicine

## 2015-04-28 ENCOUNTER — Emergency Department (HOSPITAL_COMMUNITY)
Admission: EM | Admit: 2015-04-28 | Discharge: 2015-04-28 | Disposition: A | Discharge status: Home / Self Care | Payer: 59 | Source: Home / Self Care | Attending: Family Medicine | Admitting: Family Medicine

## 2015-04-28 DIAGNOSIS — L299 Pruritus, unspecified: Secondary | ICD-10-CM | POA: Diagnosis not present

## 2015-04-28 DIAGNOSIS — H1011 Acute atopic conjunctivitis, right eye: Secondary | ICD-10-CM

## 2015-04-28 MED ORDER — BETAMETHASONE VALERATE 0.12 % EX FOAM: CUTANEOUS | Status: DC

## 2015-04-28 MED ORDER — KETOTIFEN FUMARATE 0.025 % OP SOLN: 1.0000 [drp] | Freq: Two times a day (BID) | OPHTHALMIC | Status: DC

## 2015-04-28 NOTE — ED Provider Notes (Signed)
CSN: [id number]     Arrival date & time 04/28/15  1622 History   First MD Initiated Contact with Patient 04/28/15 1753     Chief Complaint  Patient presents with  . Eye Pain  . Allergic Reaction   (Consider location/radiation/quality/duration/timing/severity/associated sxs/prior Treatment) HPI Comments: 49 year old female complaining of itching of the scalp and swelling under the right eye since yesterday. She denies using any new shampoos or chemicals to her hair. No known exposure to allergic substances. She has seen no rash to her skin or scalp. She states  there is minor tenderness to the eyelids when she closes her right eye. Denies visual problems.    Past Medical History  Diagnosis Date  . Allergy 2001  . Migraine 2015    previously treated with propranolol   . Shingles 07/07/2012  . Hyperlipidemia    Past Surgical History  Procedure Laterality Date  . Tubal ligation  1997    Family History  Problem Relation Age of Onset  . Hypertension Mother   . Cancer Neg Hx   . Heart disease Neg Hx    Social History  Substance Use Topics  . Smoking status: Never Smoker   . Smokeless tobacco: Never Used  . Alcohol Use: No   OB History    Gravida Para Term Preterm AB TAB SAB Ectopic Multiple Living   Review of Systems  Constitutional: Negative.   HENT: Negative.   Eyes: Positive for redness and itching. Negative for discharge and visual disturbance.  Respiratory: Negative.   Skin: Positive for rash.  Neurological: Negative.     Allergies  Review of patient's allergies indicates no known allergies.  Home Medications   Prior to Admission medications   Medication Sig Start Date End Date Taking? Authorizing Provider  Betamethasone Valerate (LUXIQ) 0.12 % foam Apply to scalp daily prn 04/28/15   Hayden Rasmussen, NP  ketotifen (ZADITOR) 0.025 % ophthalmic solution Place 1 drop into the right eye 2 (two) times daily. 04/28/15   Hayden Rasmussen, NP  meloxicam  (MOBIC) 15 MG tablet Take 1 tablet (15 mg total) by mouth daily. 04/03/14   Josalyn Funches, MD  nitrofurantoin, macrocrystal-monohydrate, (MACROBID) 100 MG capsule Take 1 capsule (100 mg total) by mouth 2 (two) times daily. 11/14/14   Ria Clock, PA   Meds Ordered and Administered this Visit  Medications - No data to display  BP 129/80 mmHg  Pulse 70  Temp(Src) 98.2 F (36.8 C) (Oral)  Resp 18  SpO2 98% No data found.   Physical Exam  Constitutional: She is oriented to person, place, and time. She appears well-developed and well-nourished. No distress.  Eyes:  Right eye with lower conjunctival erythema and mild swelling. Scant Clear watery discharge. No purulence.  Neck: Normal range of motion. Neck supple.  Cardiovascular: Normal rate.   Pulmonary/Chest: Effort normal. No respiratory distress.  Musculoskeletal: She exhibits no edema.  Neurological: She is alert and oriented to person, place, and time.  Skin: Skin is warm and dry.  Examination of the scalp reveals no redness, rash or other discoloration. No unusual moisture or drainage. No drying or flaking.    Nursing note and vitals reviewed.   ED Course  Procedures (including critical care time)  Labs Review Labs Reviewed - No data to display  Imaging Review No results found.   Visual Acuity Review  Right Eye Distance:   Left Eye Distance:  Bilateral Distance:    Right Eye Near:   Left Eye Near:    Bilateral Near:         MDM   1. Itching   2. Conjunctivitis, allergic, right    Likely this is an allergic type dermal reaction. No source is identified. We will treat right eye with Zaditor eyedrops and the scalp with betamethasone foam. Follow-up here PCP as needed.    Hayden Rasmussenavid Larenda Reedy, NP 04/28/15 [phone]

## 2015-04-28 NOTE — ED Notes (Signed)
Pt here with possible allergy reaction C/o right swelling and pain to lower lid with scalp itching that started yesterday Denies new medication, shampoos  Denies blurred vision or dizziness Pacific interpretor used for Spanish language

## 2015-04-28 NOTE — Discharge Instructions (Signed)
Conjuntivitis alrgica (Allergic Conjunctivitis) La conjuntivitis alrgica es la inflamacin de la membrana transparente que cubre la parte blanca del ojo y la cara interna del prpado (conjuntiva), y su causa son las alergias. Los vasos sanguneos de la conjuntiva Environmental consultantflaman, lo que hace que el ojo se torne de color rojo o rosa, y a menudo causa picazn en el ojo. La conjuntivitis alrgica no se transmite de [place] persona a la otra (no es contagiosa). CAUSAS La causa de esta afeccin es una reaccin alrgica. Entre las causas comunes de una reaccin Counselling psychologist (alrgenos) se incluyen las siguientes:  Polvo.  Polen.  Moho.  Caspa o secreciones de los [place]. FACTORES DE RIESGO Es ms probable que aparezca esta afeccin si est expuesto a altos niveles de los alrgenos que causan la Automotive engineer. Esto puede incluir estar al aire libre cuando los niveles de polen en el aire son elevados o cerca de los animales a los cuales es Best boy. SNTOMAS Los sntomas de esta afeccin pueden incluir lo siguiente:  Enrojecimiento ocular.  Secrecin lagrimal de los ojos.  Ojos llorosos.  Picazn de los ojos.  Sensacin de ardor en los ojos.  Secrecin transparente de los ojos.  Hinchazn de los prpados. DIAGNSTICO Este trastorno se puede diagnosticar mediante la historia clnica y un examen fsico. Si tiene secrecin de los ojos, se la puede Chiropractor para descartar otras causas de la conjuntivitis. TRATAMIENTO El tratamiento para esta afeccin suele incluir medicamentos, que pueden ser gotas oftlmicas, ungentos o medicamentos por va oral. Pueden ser recetados o de venta [place]. INSTRUCCIONES PARA EL CUIDADO EN EL HOGAR  Tome o aplquese los medicamentos solamente como se lo haya indicado el mdico.  No se toque ni se frote los ojos.  No use lentes de contacto hasta que la inflamacin haya desaparecido. En cambio, use anteojos.  No use maquillaje en los ojos hasta que la  inflamacin haya desaparecido.  Aplquese un pao limpio y fro en el ojo durante 10a , 3 a 4veces por da.  Trate de evitar el alrgeno que le est causando la Automotive engineer. SOLICITE ATENCIN MDICA SI:  Los sntomas empeoran.  Le supura pus del ojo.  Aparecen nuevos sntomas.  Tiene fiebre.   Esta informacin no tiene Theme park manager el consejo del mdico. Asegrese de hacerle al mdico cualquier pregunta que tenga.   Document Released: 06/16/2005 Document Revised: 07/07/2014 Elsevier Interactive Patient Education 2016 [employer].  Prurito (Pruritus) El prurito es la sensacin de picazn. Hay muchas afecciones y factores diferentes que pueden causar picazn en la piel. La piel seca es una de las causas ms frecuentes de picazn. La mayora de las causas de picazn no requieren [place]. La picazn en la piel puede convertirse en erupcin cutnea.  INSTRUCCIONES PARA EL CUIDADO EN EL HOGAR  Controle el prurito para Insurance risk surveyor cambio. Siga estos pasos para controlar la afeccin:  Cuidado de la piel  Humctese la piel segn sea necesario. Un humectante con vaselina es lo ms adecuado para mantener la humedad de la piel.  Tome o aplquese los medicamentos solamente como se lo haya indicado el mdico. Esto puede incluir lo siguiente:  Betha Loa con corticoides.  Lociones para Associate Professor.  Antihistamnicos orales.  Aplique compresas fras en las zonas afectadas.  Trate de tomar un bao con lo siguiente:  Sales de [place]. Siga las instrucciones del envase. Puede conseguirlas en la tienda de comestibles o la farmacia local.  Bicarbonato de sodio. Vierta un poco en  la baera como se lo haya indicado el mdico.  Avena coloidal. Siga las instrucciones del envase. Puede conseguirla en la tienda de comestibles o la farmacia local.  Intente colocarse una pasta de bicarbonato de sodio sobre la piel. Agregue agua al bicarbonato de sodio y  revuelva hasta alcanzar la consistencia de una pasta.   No se rasque la piel.  Evite los baos de inmersin y las duchas calientes, que pueden Geneticist, molecularempeorar la picazn. La ducha fra puede aliviar la picazn siempre que despus use un humectante.  Evite los detergentes y los jabones perfumados, y los perfumes. Utilice jabones, detergentes, perfumes y cosmticos suaves. Instrucciones generales  Evite usar ropa ajustada.  Lleve un diario como ayuda para registrar lo que le causa picazn. Escriba los siguientes datos:  Lo que come.  Los cosmticos que [place] Islandsutiliza.  Lo que bebe.  La ropa que [place]a. Esto incluye las alhajas.  Use un humidificador. Este SunGardmantiene la humedad del aire, lo que ayuda a Chartered certified accountantevitar la piel seca. SOLICITE ATENCIN MDICA SI:  La picazn no desaparece despus de Principal Financialvarios das.  Transpira de noche.  [place].  Tiene una sed inusual.  Orina ms de lo normal.  Est ms cansado que lo habitual.  Siente dolor abdominal.  Siente hormigueos en la piel.  Se siente dbil.  Tiene un color amarillo en la piel o en la zona blanca del ojo (ictericia).  Siente la piel entumecida.   Esta informacin no tiene Theme park managercomo fin reemplazar el consejo del mdico. Asegrese de hacerle al mdico cualquier pregunta que tenga.   Document Released: 02/26/2011 Document Revised: 10/31/2014 Elsevier Interactive Patient Education [employer]2016 Elsevier Inc.

## 2015-12-28 ENCOUNTER — Ambulatory Visit (INDEPENDENT_AMBULATORY_CARE_PROVIDER_SITE_OTHER): Payer: BLUE CROSS/BLUE SHIELD | Admitting: Family Medicine

## 2015-12-28 ENCOUNTER — Ambulatory Visit: Payer: BLUE CROSS/BLUE SHIELD

## 2015-12-28 VITALS — BP 120/66 | HR 66 | Temp 98.0°F | Resp 16 | Ht 63.25 in | Wt 176.8 lb

## 2015-12-28 DIAGNOSIS — S61401A Unspecified open wound of right hand, initial encounter: Secondary | ICD-10-CM | POA: Diagnosis not present

## 2015-12-28 MED ORDER — CEPHALEXIN 500 MG PO CAPS: 500.0000 mg | ORAL_CAPSULE | Freq: Two times a day (BID) | ORAL | Status: DC

## 2015-12-28 NOTE — Progress Notes (Addendum)
Subjective:  By signing my name below, I, Raven Small, attest that this documentation has been prepared under the direction and in the presence of Meredith StaggersJeffrey Ellionna Buckbee, MD.  Electronically Signed: Andrew Auaven Small, ED Scribe. 12/28/2015. 1:58 PM.   Patient ID: Rachel Newton, female    DOB: 06/22/1966, 50 y.o.   MRN: [id number]  HPI Chief Complaint  Patient presents with  . Laceration    right hand between thumb and index finger x 1 day after washing dishes    HPI Comments: Rachel Newton is a 50 y.o. female who presents to the [facility] complaining of a right hand laceration sustained 27 hours ago. Pt cut in between right 1st and 2nd fingers on a piece of glass while washing the dishes yesterday morning. At that time she had a large amount of bleeding; bleeding is controlled at this time. Pt cleaned wound with water and peroxide. She went to the pharmacy and was told to come in for stitches.   Patient Active Problem List   Diagnosis Date Noted  . High cholesterol 04/03/2014  . Pelvic pain in female 03/02/2014  . Bilateral chronic knee pain 03/02/2014  . Bilateral elbow joint pain 03/02/2014   Past Medical History  Diagnosis Date  . Allergy 2001  . Migraine 2015    previously treated with propranolol   . Shingles 07/07/2012  . Hyperlipidemia    Past Surgical History  Procedure Laterality Date  . Tubal ligation  1997    No Known Allergies Prior to Admission medications   Medication Sig Start Date End Date Taking? Authorizing Provider  Betamethasone Valerate (LUXIQ) 0.12 % foam Apply to scalp daily prn Patient not taking: Reported on 12/28/2015 04/28/15   Hayden Rasmussenavid Mabe, NP  ketotifen (ZADITOR) 0.025 % ophthalmic solution Place 1 drop into the right eye 2 (two) times daily. Patient not taking: Reported on 12/28/2015 04/28/15   Hayden Rasmussenavid Mabe, NP  meloxicam (MOBIC) 15 MG tablet Take 1 tablet (15 mg total) by mouth daily. Patient not taking: Reported on  12/28/2015 04/03/14   Dessa PhiJosalyn Funches, MD  nitrofurantoin, macrocrystal-monohydrate, (MACROBID) 100 MG capsule Take 1 capsule (100 mg total) by mouth 2 (two) times daily. Patient not taking: Reported on 12/28/2015 11/14/14   Ria ClockJennifer Lee H Presson, PA   Social History   Social History  . Marital Status: Married    Spouse Name: N/A  . Number of Children: 3   . Years of Education: 6   Occupational History  . Print line  [employer]   Social History Main Topics  . Smoking status: Never Smoker   . Smokeless tobacco: Never Used  . Alcohol Use: No  . Drug Use: No  . Sexual Activity: Yes    Birth Control/ Protection: None   Other Topics Concern  . Not on file   Social History Narrative   Lives at home with husband, son ([id number]), and mother.   2 daughters married    [place]   Moved to [place] in 1985, back to [place] in 1990 x 2 years got married, came back, back since 1992.      Review of Systems  Skin: Positive for wound.  Neurological: Negative for weakness and numbness.    Objective:   Physical Exam  Constitutional: She is oriented to person, place, and time. She appears well-developed and well-nourished. No distress.  HENT:  Head: Normocephalic and atraumatic.  Eyes: Conjunctivae and EOM are normal.  Neck: Neck supple.  Cardiovascular: Normal rate.   Pulmonary/Chest: Effort normal.  Musculoskeletal: Normal range of motion.   Full ROM and full stregnth of thumb. No bony tenderness  Neurological: She is alert and oriented to person, place, and time.  Skin: Skin is warm and dry.  Wound approximately 1.2 cm at the interdigital skin between thumb and 2nd finger. Superficial with a small flap at the dorsal aspect. Base of wound is visualized. No fat or deep structures. 2 small abrasion one on the palm with small flap, no active bleeding.  Psychiatric: She has a normal mood and affect. Her behavior is normal.  Nursing note and vitals reviewed.   Filed Vitals:    12/28/15 1304  BP: 120/66  Pulse: 66  Temp: 98 F (36.7 C)  TempSrc: Oral  Resp: 16  Height: 5' 3.25" (1.607 m)  Weight: 176 lb 12.8 oz (80.196 kg)  SpO2: 98%     Assessment & Plan:   Rachel Newton is a 50 y.o. female Wound, open, hand with or without fingers, right, initial encounter - Plan: cephALEXin (KEFLEX) 500 MG capsule  Superficial wound of right hand without deep tissue involvement. Now approximately 26 hours since injury. Discussed reasons for not placing sutures today including amount of time since injury as well as degree of wound. I suspect this will heal by secondary intention., no signs or symptoms of infection at present.  - Flap of skin was trimmed from large wound, wound care discussed, start Keflex to lessen chance of infection. rtc precautions. Spanish spoken. Wound care precautions were discussed including any persistent bleeding, redness, discharge or pus, or swelling. Understanding was expressed. Meds ordered this encounter  Medications  . cephALEXin (KEFLEX) 500 MG capsule    Sig: Take 1 capsule (500 mg total) by mouth 2 (two) times daily.    Dispense:  14 capsule    Refill:  0   Patient Instructions       IF you received an x-ray today, you will receive an invoice from [facility]. Please contact [facility] at [phone] with questions or concerns regarding your invoice.   IF you received labwork today, you will receive an invoice from [facility]. Please contact [facility] at [phone] with questions or concerns regarding your invoice.   Our billing staff will not be able to assist you with questions regarding bills from these companies.  You will be contacted with the lab results as soon as they are available. The fastest way to get your results is to activate your My Chart account. Instructions are located on the last page of this paperwork. If you have not heard from us regarding the results  in 2 weeks, please contact this office.        I personally performed the services described in this documentation, which was scribed in my presence. The recorded information has been reviewed and considered, and addended by me as needed.   Signed,   Meredith StaggersJeffrey Cambre Matson, MD [facility].  12/28/2015 2:22 PM

## 2015-12-28 NOTE — Patient Instructions (Addendum)
     IF you received an x-ray today, you will receive an invoice from [facility]. Please contact [facility] at [phone] with questions or concerns regarding your invoice.   IF you received labwork today, you will receive an invoice from [facility]. Please contact [facility] at [phone] with questions or concerns regarding your invoice.   Our billing staff will not be able to assist you with questions regarding bills from these companies.  You will be contacted with the lab results as soon as they are available. The fastest way to get your results is to activate your My Chart account. Instructions are located on the last page of this paperwork. If you have not heard from us regarding the results in 2 weeks, please contact this office.      

## 2016-01-31 ENCOUNTER — Emergency Department (HOSPITAL_COMMUNITY)
Admission: EM | Admit: 2016-01-31 | Discharge: 2016-02-01 | Disposition: A | Discharge status: Home / Self Care | Payer: BLUE CROSS/BLUE SHIELD | Attending: Emergency Medicine | Admitting: Emergency Medicine

## 2016-01-31 ENCOUNTER — Emergency Department (HOSPITAL_COMMUNITY): Payer: BLUE CROSS/BLUE SHIELD

## 2016-01-31 ENCOUNTER — Encounter (HOSPITAL_COMMUNITY): Payer: Self-pay | Admitting: *Deleted

## 2016-01-31 DIAGNOSIS — X58XXXA Exposure to other specified factors, initial encounter: Secondary | ICD-10-CM | POA: Diagnosis not present

## 2016-01-31 DIAGNOSIS — M25872 Other specified joint disorders, left ankle and foot: Secondary | ICD-10-CM

## 2016-01-31 DIAGNOSIS — Y939 Activity, unspecified: Secondary | ICD-10-CM | POA: Diagnosis not present

## 2016-01-31 DIAGNOSIS — S39011A Strain of muscle, fascia and tendon of abdomen, initial encounter: Secondary | ICD-10-CM | POA: Insufficient documentation

## 2016-01-31 DIAGNOSIS — Y929 Unspecified place or not applicable: Secondary | ICD-10-CM | POA: Insufficient documentation

## 2016-01-31 DIAGNOSIS — T148XXA Other injury of unspecified body region, initial encounter: Secondary | ICD-10-CM

## 2016-01-31 DIAGNOSIS — S3991XA Unspecified injury of abdomen, initial encounter: Secondary | ICD-10-CM | POA: Diagnosis present

## 2016-01-31 DIAGNOSIS — R21 Rash and other nonspecific skin eruption: Secondary | ICD-10-CM | POA: Insufficient documentation

## 2016-01-31 DIAGNOSIS — Y999 Unspecified external cause status: Secondary | ICD-10-CM | POA: Diagnosis not present

## 2016-01-31 DIAGNOSIS — M25842 Other specified joint disorders, left hand: Secondary | ICD-10-CM

## 2016-01-31 LAB — URINE MICROSCOPIC-ADD ON: RBC / HPF: NONE SEEN RBC/hpf (ref 0–5)

## 2016-01-31 LAB — CBC
HCT: 41.5 % (ref 36.0–46.0)
HEMOGLOBIN: 13.3 g/dL (ref 12.0–15.0)
MCH: 28.5 pg (ref 26.0–34.0)
MCHC: 32 g/dL (ref 30.0–36.0)
MCV: 89.1 fL (ref 78.0–100.0)
Platelets: 229 10*3/uL (ref 150–400)
RBC: 4.66 MIL/uL (ref 3.87–5.11)
RDW: 13.2 % (ref 11.5–15.5)
WBC: 5.9 10*3/uL (ref 4.0–10.5)

## 2016-01-31 LAB — URINALYSIS, ROUTINE W REFLEX MICROSCOPIC
Bilirubin Urine: NEGATIVE
Glucose, UA: NEGATIVE mg/dL
HGB URINE DIPSTICK: NEGATIVE
Ketones, ur: NEGATIVE mg/dL
Nitrite: NEGATIVE
Protein, ur: NEGATIVE mg/dL
Specific Gravity, Urine: 1.016 (ref 1.005–1.030)
pH: 5.5 (ref 5.0–8.0)

## 2016-01-31 LAB — BASIC METABOLIC PANEL
Anion gap: 6 (ref 5–15)
BUN: 16 mg/dL (ref 6–20)
CHLORIDE: 106 mmol/L (ref 101–111)
CO2: 26 mmol/L (ref 22–32)
Calcium: 8.9 mg/dL (ref 8.9–10.3)
Creatinine, Ser: 0.72 mg/dL (ref 0.44–1.00)
GFR calc Af Amer: >60 mL/min (ref >60)
GFR calc non Af Amer: >60 mL/min (ref >60)
GLUCOSE: 115 mg/dL — AB (ref 65–99)
Potassium: 3.6 mmol/L (ref 3.5–5.1)
SODIUM: 138 mmol/L (ref 135–145)

## 2016-01-31 LAB — PREGNANCY, URINE: Preg Test, Ur: NEGATIVE

## 2016-01-31 MED ORDER — KETOROLAC TROMETHAMINE 60 MG/2ML IM SOLN
30.0000 mg | Freq: Once | INTRAMUSCULAR | Status: AC
Start: 2016-01-31 — End: 2016-01-31
  Administered 2016-01-31: 30 mg via INTRAMUSCULAR
  Filled 2016-01-31: qty 2

## 2016-01-31 NOTE — ED Provider Notes (Signed)
[facility]-EMERGENCY DEPT Provider Note   CSN: [id number] Arrival date & time: 01/31/16  1734  First Provider Contact:  None     History   Chief Complaint Chief Complaint  Patient presents with  . Flank Pain  . Rash    HPI Rachel Newton is a 50 y.o. female.  The history is provided by the patient. No language interpreter was used.  Back Pain   This is a new problem. The current episode started yesterday. The problem occurs daily. The problem has not changed since onset.The pain is associated with no known injury. Pain location: L flank region. The quality of the pain is described as stabbing. The pain does not radiate. The pain is moderate. The symptoms are aggravated by bending, twisting and certain positions (palpation). The pain is the same all the time. Pertinent negatives include no chest pain, no fever, no numbness, no headaches, no abdominal pain, no bowel incontinence, no leg pain and no tingling. She has tried NSAIDs for the symptoms. The treatment provided mild relief. Risk factors include obesity, lack of exercise, poor posture and a sedentary lifestyle.    Past Medical History:  Diagnosis Date  . Allergy 2001  . Hyperlipidemia   . Migraine 2015   previously treated with propranolol   . Shingles 07/07/2012    Patient Active Problem List   Diagnosis Date Noted  . High cholesterol 04/03/2014  . Pelvic pain in female 03/02/2014  . Bilateral chronic knee pain 03/02/2014  . Bilateral elbow joint pain 03/02/2014    Past Surgical History:  Procedure Laterality Date  . TUBAL LIGATION  1997     OB History    Gravida Para Term Preterm AB Living   3 3 3     3    SAB TAB Ectopic Multiple Live Births                   Home Medications    Prior to Admission medications   Medication Sig Start Date End Date Taking? Authorizing Provider  cephALEXin (KEFLEX) 500 MG capsule Take 1 capsule (500 mg total) by mouth 2 (two) times daily. 12/28/15   Shade Flood, MD      Family History Family History  Problem Relation Age of Onset  . Hypertension Mother   . Cancer Neg Hx   . Heart disease Neg Hx     Social History Social History  Substance Use Topics  . Smoking status: Never Smoker  . Smokeless tobacco: Never Used  . Alcohol use No     Allergies   Review of patient's allergies indicates no known allergies.   Review of Systems Review of Systems  Constitutional: Negative for fever.  HENT: Negative.   Eyes: Negative.   Respiratory: Negative for shortness of breath.   Cardiovascular: Negative for chest pain.  Gastrointestinal: Negative for abdominal pain and bowel incontinence.  Genitourinary: Negative for hematuria.       Occasional small amount of vaginal bleeding  Musculoskeletal: Positive for back pain.  Skin: Negative for pallor.  Neurological: Negative for tingling, syncope, numbness and headaches.  Psychiatric/Behavioral: Negative for agitation and confusion.     Physical Exam Updated Vital Signs BP 131/68   Pulse 63   Temp 98.6 F (37 C) (Oral)   Resp 16   SpO2 99%   Physical Exam  Constitutional: She is oriented to person, place, and time. She appears well-developed and well-nourished. No distress.  HENT:  Head: Normocephalic and atraumatic.  Eyes:  Conjunctivae are normal. Right eye exhibits no discharge. Left eye exhibits no discharge.  Neck: Normal range of motion. Neck supple. No tracheal deviation present.  Cardiovascular: Normal rate, regular rhythm, normal heart sounds and intact distal pulses.  Exam reveals no gallop and no friction rub.   No murmur heard. Pulmonary/Chest: Effort normal and breath sounds normal. No stridor. No respiratory distress. She has no wheezes. She has no rales.  Abdominal: Soft. Bowel sounds are normal. She exhibits no distension. There is no tenderness. There is no rebound and no guarding.  Musculoskeletal: Normal range of motion. She exhibits no edema, tenderness or deformity.        Back:  TTP along L flank. Localized.  No gross abnormality along L thumb, no concern for abscess or tendinitis. No pain currently. No warmth or swelling. No drainage  Neurological: She is alert and oriented to person, place, and time.  MAEI Stable gait  Skin: Skin is warm and dry. Capillary refill takes less than 2 seconds. She is not diaphoretic.  Psychiatric: She has a normal mood and affect. Her behavior is normal. Thought content normal.  Nursing note and vitals reviewed.    ED Treatments / Results  Labs (all labs ordered are listed, but only abnormal results are displayed) Labs Reviewed  URINALYSIS, ROUTINE W REFLEX MICROSCOPIC (NOT AT [facility]) - Abnormal; Notable for the following:       Result Value   Leukocytes, UA SMALL (*)    All other components within normal limits  URINE MICROSCOPIC-ADD ON - Abnormal; Notable for the following:    Squamous Epithelial / LPF 0-5 (*)    Bacteria, UA RARE (*)    All other components within normal limits  BASIC METABOLIC PANEL - Abnormal; Notable for the following:    Glucose, Bld 115 (*)    All other components within normal limits  PREGNANCY, URINE  CBC    EKG  EKG Interpretation None       Radiology No results found.  Negative L rib xray  Procedures Procedures (including critical care time)  Medications Ordered in ED Medications  ketorolac (TORADOL) injection 30 mg (30 mg Intramuscular Given 01/31/16 2239)     Initial Impression / Assessment and Plan / ED Course  I have reviewed the triage vital signs and the nursing notes.  Pertinent labs & imaging results that were available during my care of the patient were reviewed by me and considered in my medical decision making (see chart for details).  Clinical Course   50 year old female with no significant past medical history presents with left mid back pain.  Patient denies any recent trauma or abnormal movement however pain is worse with movement and patient does have  tenderness to palpation at that location.  Patient also had concerns about recurrentleft thumb drainage. L thumb appeared to be WNL; no signs of infection or draginage, in the setting of her not having infectious symptoms including no fevers or chills.  Deferred thumb querry to outpatient follow-up since it did not appear to require antibiotics or drainage at this time.  Plain films of the left ribs was obtained which was unremarkable.  UA was obtained to rule out stone, and was WNL as well. Patient was given IM Toradol for pain and encouraged to take NSAIDs for pain control at home.  Discussed results and performed evaluation with the assistance of translator.  All questions answered.  Encouraged PCP follow-up for reevaluation and monitoring of symptoms.  Patient and vital signs  stable discharge.  Final Clinical Impressions(s) / ED Diagnoses   Final diagnoses:  Musculoskeletal strain  Disorder of digit, left    New Prescriptions New Prescriptions   No medications on file     Maretta Bees, MD 02/03/16 2213    Lavera Guise, MD 02/04/16 1208

## 2016-01-31 NOTE — ED Triage Notes (Signed)
Pt c/o left flank pain x 2 days. Also c/o dryness/rash to thumb on left hand. States it happens every year and it will open up.

## 2016-02-01 ENCOUNTER — Ambulatory Visit: Payer: BLUE CROSS/BLUE SHIELD

## 2016-02-01 NOTE — Discharge Instructions (Signed)
PUEDE ALTERNAR IBUPROFEN 400MG  CADA 6 HORAS CON TYLENOL 650MG  CADA 6 HORAS

## 2016-02-01 NOTE — ED Provider Notes (Signed)
I saw and evaluated the patient, reviewed the resident's note and I agree with the findings and plan.   EKG Interpretation None      50 year old female who presents with left side pain and rash over left thumb. One day of left side pain. Worse with movement and palpation. No chest pain, difficulty breathing, abdominal pain, n/v/d, hematuria, dysuria, frequency, fever or chills. Pain not pleuritic. No fall, trauma, or strenuous or repetitive activity. Well appearing in no acute distress. Reproducible pain over the intercoastal muscles involving ribs 11-12 in the mid axillary line. NO CVA tenderness, no midline TLS spine tenderness, no abdominal tenderness. UA w/o infection or blood. Presentation seems c/w likely MSK etiology. No c/f spinal, intraabdominal or retroperitoneal process. CXR normal. Does not seem consistent with emergent intrathoracic or cardiopulmonary process. Will continue supportive care measures at this time at home.   In regards to rash, there is dried flaky skin over pad of the left thumb. No swelling, significant tenderness, drainage, blistering. Unclear of etiology, but states it happens once yearly during this time of year. No topical exposures. Patient to do supportive care, warm soaks, and follow-up with PCP.  The patient appears reasonably screened and/or stabilized for discharge and I doubt any other medical condition or other [facility] requiring further screening, evaluation, or treatment in the ED at this time prior to discharge. Strict return and follow-up instructions reviewed. She expressed understanding of all discharge instructions and felt comfortable with the plan of care.    Lavera Guise, MD 02/01/16 1145

## 2016-05-27 ENCOUNTER — Other Ambulatory Visit: Payer: Self-pay | Admitting: Family Medicine

## 2016-05-27 DIAGNOSIS — Z1231 Encounter for screening mammogram for malignant neoplasm of breast: Secondary | ICD-10-CM

## 2016-05-30 ENCOUNTER — Ambulatory Visit: Payer: BLUE CROSS/BLUE SHIELD

## 2016-06-02 ENCOUNTER — Ambulatory Visit
Admission: RE | Admit: 2016-06-02 | Discharge: 2016-06-02 | Disposition: A | Discharge status: Home / Self Care | Payer: BLUE CROSS/BLUE SHIELD | Source: Ambulatory Visit | Attending: Family Medicine | Admitting: Family Medicine

## 2016-06-02 DIAGNOSIS — Z1231 Encounter for screening mammogram for malignant neoplasm of breast: Secondary | ICD-10-CM

## 2016-06-13 ENCOUNTER — Ambulatory Visit: Payer: BLUE CROSS/BLUE SHIELD

## 2016-09-17 ENCOUNTER — Ambulatory Visit
Admission: RE | Admit: 2016-09-17 | Discharge: 2016-09-17 | Disposition: A | Discharge status: Home / Self Care | Payer: BLUE CROSS/BLUE SHIELD | Source: Ambulatory Visit | Attending: Family Medicine | Admitting: Family Medicine

## 2016-09-17 ENCOUNTER — Other Ambulatory Visit: Payer: Self-pay | Admitting: Family Medicine

## 2016-09-17 DIAGNOSIS — R059 Cough, unspecified: Secondary | ICD-10-CM

## 2016-09-17 DIAGNOSIS — R05 Cough: Secondary | ICD-10-CM

## 2016-10-17 ENCOUNTER — Emergency Department (HOSPITAL_COMMUNITY)
Admission: EM | Admit: 2016-10-17 | Discharge: 2016-10-17 | Disposition: A | Discharge status: Home / Self Care | Payer: BLUE CROSS/BLUE SHIELD | Attending: Emergency Medicine | Admitting: Emergency Medicine

## 2016-10-17 ENCOUNTER — Encounter (HOSPITAL_COMMUNITY): Payer: Self-pay

## 2016-10-17 DIAGNOSIS — N3000 Acute cystitis without hematuria: Secondary | ICD-10-CM | POA: Insufficient documentation

## 2016-10-17 DIAGNOSIS — R1030 Lower abdominal pain, unspecified: Secondary | ICD-10-CM | POA: Diagnosis present

## 2016-10-17 LAB — URINALYSIS, ROUTINE W REFLEX MICROSCOPIC
Bilirubin Urine: NEGATIVE
GLUCOSE, UA: NEGATIVE mg/dL
Ketones, ur: NEGATIVE mg/dL
NITRITE: NEGATIVE
PH: 7 (ref 5.0–8.0)
Protein, ur: NEGATIVE mg/dL
SPECIFIC GRAVITY, URINE: 1.001 — AB (ref 1.005–1.030)
Squamous Epithelial / LPF: NONE SEEN

## 2016-10-17 LAB — CBG MONITORING, ED: Glucose-Capillary: 92 mg/dL (ref 65–99)

## 2016-10-17 MED ORDER — CEPHALEXIN 500 MG PO CAPS
500.0000 mg | ORAL_CAPSULE | Freq: Once | ORAL | Status: AC
  Administered 2016-10-17: 500 mg via ORAL
  Filled 2016-10-17: qty 1

## 2016-10-17 MED ORDER — CEPHALEXIN 500 MG PO CAPS
500.0000 mg | ORAL_CAPSULE | Freq: Four times a day (QID) | ORAL | 0 refills | Status: DC
Start: 2016-10-17 — End: 2017-01-11

## 2016-10-17 MED ORDER — OXYCODONE-ACETAMINOPHEN 5-325 MG PO TABS
1.0000 | ORAL_TABLET | ORAL | Status: DC | PRN
  Administered 2016-10-17: 1 via ORAL
  Filled 2016-10-17: qty 1

## 2016-10-17 NOTE — ED Triage Notes (Signed)
Pt has been treated twice for a UTI.  Pt states pain is not improving.  Pt having severe pain in lower abdomen and pelvis.  Painful urination.  Pt states no vaginal discharge.  No n/v/d

## 2016-10-17 NOTE — ED Notes (Signed)
Per Dr. Patria Mane, hold on abdominal labs and get UA

## 2016-10-17 NOTE — ED Provider Notes (Signed)
[facility]-EMERGENCY DEPT Provider Note   CSN: [id number] Arrival date & time: 10/17/16  1505     History   Chief Complaint Chief Complaint  Patient presents with  . Abdominal Pain    HPI Rachel Newton is a 51 y.o. female.  The history is provided by the patient. No language interpreter was used.  Abdominal Pain   This is a new problem. The current episode started 2 days ago. The problem occurs constantly. The problem has not changed since onset.The pain is associated with an unknown factor. The pain is located in the suprapubic region. The quality of the pain is aching. The pain is moderate. Associated symptoms include dysuria and frequency. Nothing aggravates the symptoms. Nothing relieves the symptoms.  Pt complains of a uti.  Pt reports symptoms began 2 days ago.  Pt reports she has had frequent uti's.    Past Medical History:  Diagnosis Date  . Allergy 2001  . Hyperlipidemia   . Migraine 2015   previously treated with propranolol   . Shingles 07/07/2012    Patient Active Problem List   Diagnosis Date Noted  . High cholesterol 04/03/2014  . Pelvic pain in female 03/02/2014  . Bilateral chronic knee pain 03/02/2014  . Bilateral elbow joint pain 03/02/2014    Past Surgical History:  Procedure Laterality Date  . TUBAL LIGATION  1997     OB History    Gravida Para Term Preterm AB Living   SAB TAB Ectopic Multiple Live Births                   Home Medications    Prior to Admission medications   Medication Sig Start Date End Date Taking? Authorizing Provider  cephALEXin (KEFLEX) 500 MG capsule Take 1 capsule (500 mg total) by mouth 4 (four) times daily. 10/17/16   Elson Areas, PA-C    Family History Family History  Problem Relation Age of Onset  . Hypertension Mother   . Cancer Neg Hx   . Heart disease Neg Hx     Social History Social History  Substance Use Topics  . Smoking status: Never Smoker  . Smokeless tobacco: Never Used  .  Alcohol use No     Allergies   Patient has no known allergies.   Review of Systems Review of Systems  Gastrointestinal: Positive for abdominal pain.  Genitourinary: Positive for dysuria and frequency.  All other systems reviewed and are negative.    Physical Exam Updated Vital Signs BP (!) 147/76 (BP Location: Right Arm)   Pulse 68   Temp 98 F (36.7 C) (Oral)   Resp 18   SpO2 100%   Physical Exam  Constitutional: She appears well-developed and well-nourished. No distress.  HENT:  Head: Normocephalic and atraumatic.  Eyes: Conjunctivae are normal.  Neck: Neck supple.  Cardiovascular: Normal rate and regular rhythm.   No murmur heard. Pulmonary/Chest: Effort normal and breath sounds normal. No respiratory distress.  Abdominal: Soft. There is no tenderness.  Musculoskeletal: She exhibits no edema.  Neurological: She is alert.  Skin: Skin is warm and dry.  Psychiatric: She has a normal mood and affect.  Nursing note and vitals reviewed.    ED Treatments / Results  Labs (all labs ordered are listed, but only abnormal results are displayed) Labs Reviewed  URINALYSIS, ROUTINE W REFLEX MICROSCOPIC - Abnormal; Notable for the following:       Result Value  Color, Urine STRAW (*)    Specific Gravity, Urine 1.001 (*)    Hgb urine dipstick LARGE (*)    Leukocytes, UA LARGE (*)    Bacteria, UA RARE (*)    All other components within normal limits  URINE CULTURE  CBG MONITORING, ED    EKG  EKG Interpretation None       Radiology No results found.  Procedures Procedures (including critical care time)  Medications Ordered in ED Medications  oxyCODONE-acetaminophen (PERCOCET/ROXICET) 5-325 MG per tablet 1 tablet (1 tablet Oral Given 10/17/16 1613)  cephALEXin (KEFLEX) capsule 500 mg (500 mg Oral Given 10/17/16 1914)     Initial Impression / Assessment and Plan / ED Course  I have reviewed the triage vital signs and the nursing notes.  Pertinent labs &  imaging results that were available during my care of the patient were reviewed by me and considered in my medical decision making (see chart for details).       Final Clinical Impressions(s) / ED Diagnoses   Final diagnoses:  Acute cystitis without hematuria    New Prescriptions Discharge Medication List as of 10/17/2016  6:48 PM    An After Visit Summary was printed and given to the patient. Meds ordered this encounter  Medications  . oxyCODONE-acetaminophen (PERCOCET/ROXICET) 5-325 MG per tablet 1 tablet  . cephALEXin (KEFLEX) capsule 500 mg  . cephALEXin (KEFLEX) 500 MG capsule    Sig: Take 1 capsule (500 mg total) by mouth 4 (four) times daily.    Dispense:  40 capsule    Refill:  0    Order Specific Question:   Supervising Provider    Answer:   Eber Hong [[id number]]     Lonia Skinner [place], PA-C 10/17/16 2105    Charlynne Pander, MD 10/18/16 [phone]

## 2016-10-20 LAB — URINE CULTURE: Culture: >=100000 — AB

## 2016-10-21 ENCOUNTER — Telehealth: Payer: Self-pay | Admitting: Emergency Medicine

## 2016-10-21 NOTE — Telephone Encounter (Signed)
Post ED Visit - Positive Culture Follow-up  Culture report reviewed by antimicrobial stewardship pharmacist:   Enzo Bi, Pharm.D.  Celedonio Miyamoto, Pharm.D., BCPS AQ-ID  Garvin Fila, Pharm.D., BCPS  Georgina Pillion, [address].D., BCPS  [place], [address].D., BCPS, AAHIVP  Estella Husk, Pharm.D., BCPS, AAHIVP  Lysle Pearl, PharmD, BCPS  Casilda Carls, PharmD, BCPS  Pollyann Samples, PharmD, BCPS  Positive urine culture Treated with cephalexin, organism sensitive to the same and no further patient follow-up is required at this time.  Berle Mull 10/21/2016, 10:27 AM

## 2016-11-12 ENCOUNTER — Encounter: Payer: Self-pay | Admitting: Family Medicine

## 2017-01-11 ENCOUNTER — Ambulatory Visit (HOSPITAL_COMMUNITY)
Admission: EM | Admit: 2017-01-11 | Discharge: 2017-01-11 | Disposition: A | Discharge status: Home / Self Care | Payer: BLUE CROSS/BLUE SHIELD | Attending: Internal Medicine | Admitting: Internal Medicine

## 2017-01-11 ENCOUNTER — Encounter (HOSPITAL_COMMUNITY): Payer: Self-pay | Admitting: Family Medicine

## 2017-01-11 DIAGNOSIS — M545 Low back pain, unspecified: Secondary | ICD-10-CM

## 2017-01-11 MED ORDER — NAPROXEN 500 MG PO TABS: 500.0000 mg | ORAL_TABLET | Freq: Two times a day (BID) | ORAL | 0 refills | Status: AC

## 2017-01-11 MED ORDER — CYCLOBENZAPRINE HCL 10 MG PO TABS: 5.0000 mg | ORAL_TABLET | Freq: Three times a day (TID) | ORAL | 0 refills | Status: DC | PRN

## 2017-01-11 NOTE — ED Provider Notes (Signed)
01/11/2017 2:05 PM   DOB: [birth year] / MRN: [id number]  SUBJECTIVE:  Rachel SitesMaria C Newton is a 51 y.o. female presenting for back pain that started yesterday.  Denies dysuria.  Tells me that she has pain to the right lateral thigh. She tried Aleve 3 tabs this morning with good relief.  She feels that she is getting worse.  She does not smoke. Tells me that   She has No Known Allergies.   She  has a past medical history of Allergy (2001); Hyperlipidemia; Migraine (2015); and Shingles (07/07/2012).    She  reports that she has never smoked. She has never used smokeless tobacco. She reports that she does not drink alcohol or use drugs. She  reports that she currently engages in sexual activity. She reports using the following method of birth control/protection: None. The patient  has a past surgical history that includes Tubal ligation (1997 ).  Her family history includes Hypertension in her mother.  Review of Systems  Constitutional: Negative for chills and fever.  Genitourinary: Negative for dysuria and flank pain.  Musculoskeletal: Positive for back pain.  Skin: Negative for itching and rash.  Neurological: Positive for tingling. Negative for dizziness, tremors, sensory change, speech change, focal weakness and headaches.  Psychiatric/Behavioral: Positive for depression. Negative for hallucinations, memory loss, substance abuse and suicidal ideas. The patient is not nervous/anxious and does not have insomnia.     The problem list and medications were reviewed and updated by myself where necessary and exist elsewhere in the encounter.   OBJECTIVE:  BP (!) 140/56   Pulse 68   Temp 98.3 F (36.8 C)   Resp 18   SpO2 100%   Physical Exam  Constitutional: She is oriented to person, place, and time. She appears well-developed and well-nourished.  Cardiovascular: Normal rate and regular rhythm.   Pulmonary/Chest: Effort normal and breath sounds normal.  Musculoskeletal: She exhibits no  edema, tenderness or deformity.       Lumbar back: She exhibits decreased range of motion and pain. She exhibits no tenderness, no bony tenderness, no swelling, no edema, no deformity and no laceration.  Neurological: She is alert and oriented to person, place, and time. She has normal reflexes. She displays no atrophy and normal reflexes. No cranial nerve deficit or sensory deficit. She exhibits normal muscle tone. Coordination and gait normal.  Reflex Scores:      Patellar reflexes are 2+ on the right side and 2+ on the left side.      Achilles reflexes are 2+ on the right side and 2+ on the left side. Skin: Skin is warm and dry.  Psychiatric: She has a normal mood and affect. Her behavior is normal. Judgment and thought content normal.    No results found for this or any previous visit (from the past 72 hour(s)).  No results found.  ASSESSMENT AND PLAN:  Acute right-sided low back pain without sciatica  She has some right sided radiculopathy about L2-L4.  Will treat with a nsaid and flexeril and advised that if this goes lo0nger than 3 weeks to return here of see her PCP.   The patient is advised to call or return to clinic if she does not see an improvement in symptoms, or to seek the care of the closest emergency department if she worsens with the above plan.   Deliah BostonMichael Clark, MHS, PA-C [facility] 01/11/2017 2:05 PM    Ofilia Neaslark, Michael L, PA-C  01/11/17 1405  

## 2017-01-11 NOTE — ED Triage Notes (Signed)
Pt here for right back pain radiating down her right leg with tingling. sts started last night. sts also headache.

## 2017-01-11 NOTE — Discharge Instructions (Signed)
Come back in about three weeks as needed.

## 2017-04-24 ENCOUNTER — Ambulatory Visit (INDEPENDENT_AMBULATORY_CARE_PROVIDER_SITE_OTHER): Payer: BLUE CROSS/BLUE SHIELD | Admitting: Urgent Care

## 2017-04-24 ENCOUNTER — Encounter: Payer: Self-pay | Admitting: Urgent Care

## 2017-04-24 VITALS — HR 82 | Temp 98.3°F | Resp 16 | Ht 63.25 in | Wt 185.2 lb

## 2017-04-24 DIAGNOSIS — R21 Rash and other nonspecific skin eruption: Secondary | ICD-10-CM | POA: Diagnosis not present

## 2017-04-24 DIAGNOSIS — Z23 Encounter for immunization: Secondary | ICD-10-CM

## 2017-04-24 DIAGNOSIS — L299 Pruritus, unspecified: Secondary | ICD-10-CM | POA: Diagnosis not present

## 2017-04-24 MED ORDER — CETIRIZINE HCL 10 MG PO TABS
10.0000 mg | ORAL_TABLET | Freq: Every day | ORAL | 11 refills | Status: DC
Start: 2017-04-24 — End: 2019-03-17

## 2017-04-24 MED ORDER — HYDROXYZINE PAMOATE 50 MG PO CAPS: 50.0000 mg | ORAL_CAPSULE | Freq: Three times a day (TID) | ORAL | 0 refills | Status: DC | PRN

## 2017-04-24 MED ORDER — RANITIDINE HCL 150 MG PO TABS: 150.0000 mg | ORAL_TABLET | Freq: Two times a day (BID) | ORAL | 0 refills | Status: DC

## 2017-04-24 NOTE — Progress Notes (Signed)
  MRN: [id number] DOB: 09/07/1965  Subjective:   Curtis SitesMaria C Cordova is a 51 y.o. female presenting for chief complaint of Rash (started a month ago went away then spread all over )  Reports 1 month history of intermittent pruritic and stinging lesions, hives over her extremities and torso.  Has been prescribed permethrin cream, steroid taper. She has been using rubbing alcohol as well. Denies fever, dizziness, headache, cough, n/v, abdominal pain. She has checked her home and denies having bed bugs. Her son and husband had some hives/lesions initially but not to the extent the patient has had and their symptoms were very transient. She has not changed her routine, started using new products for hygiene, is eating foods she normally eats, no new medications.   Byrd HesselbachMaria has a current medication list which includes the following prescription(s): cyclobenzaprine. Also has No Known Allergies.  Byrd HesselbachMaria  has a past medical history of Allergy (2001); Hyperlipidemia; Migraine (2015); and Shingles (07/07/2012). Also  has a past surgical history that includes Tubal ligation (1997 ).  Objective:   Vitals: Pulse 82   Temp 98.3 F (36.8 C)   Resp 16   Ht 5' 3.25" (1.607 m)   Wt 185 lb 3.2 oz (84 kg)   SpO2 99%   BMI 32.55 kg/m   Physical Exam  Constitutional: She is oriented to person, place, and time. She appears well-developed and well-nourished.  Cardiovascular: Normal rate.   Pulmonary/Chest: Effort normal.  Neurological: She is alert and oriented to person, place, and time.  Skin: Skin is warm and dry. Rash (multiple urticarial type lesions scattered over extremities and ~3 over her lower back) noted.   Assessment and Plan :   1. Rash and nonspecific skin eruption 2. Itching - Will have patient switch to products for sensitive skin. Start hydroxyzine, switch to Zyrtec and Zantac if she has drowsiness. Follow up within 1 week if symptoms persist, consider IM Depomedrol.  - hydrOXYzine (VISTARIL) 50 MG  capsule; Take 1 capsule (50 mg total) by mouth 3 (three) times daily as needed.  Dispense: 30 capsule; Refill: 0  3. Need for prophylactic vaccination and inoculation against influenza - Flu Vaccine QUAD 6+ mos PF IM (Fluarix Quad PF)   Wallis BambergMario Sherril Shipman, PA-C [facility] at [facility] [phone] 04/24/2017  3:55 PM

## 2017-04-24 NOTE — Patient Instructions (Addendum)
Erupcin cutnea (Rash) Una erupcin cutnea es un cambio en el color de la piel. Una erupcin tambin puede cambiar la forma en que se siente la piel. Hay muchas afecciones y factores diferentes que pueden causar una erupcin. INSTRUCCIONES PARA EL CUIDADO EN EL HOGAR Est atento a cualquier cambio en los sntomas. Estas indicaciones pueden ayudarlo con el trastorno: Medicamentos Tome o aplquese los medicamentos de venta libre y recetados solamente como se lo haya indicado el mdico. Estos pueden incluir lo siguiente:  Crema con corticoides.  Lociones para aliviar la picazn.  Antihistamnicos por va oral. Cuidado de la piel  Aplique compresas fras en las zonas afectadas.  Trate de tomar un bao con lo siguiente: ? Sales de [place]. Siga las instrucciones del envase. Puede conseguirlas en la tienda de comestibles o la farmacia local. ? Bicarbonato de sodio. Vierta un poco en la baera como se lo haya indicado el mdico. ? Avena coloidal. Siga las instrucciones del envase. Puede conseguirla en la tienda de comestibles o la farmacia local.  Intente colocarse una pasta de bicarbonato de sodio sobre la piel. Agregue agua al bicarbonato hasta que tenga la consistencia de una pasta.  No se rasque ni se refriegue la piel.  Evite cubrir la erupcin. Asegrese de que la erupcin est expuesta al aire todo lo posible. Instrucciones generales  Evite los baos de inmersin y las duchas calientes, que pueden empeorar la picazn. Una ducha fra puede aliviar.  Evite los detergentes y los jabones perfumados, y los perfumes. Utilice jabones, detergentes, perfumes y cosmticos suaves.  Evite las sustancias que causan la erupcin. Lleve un diario como ayuda para registrar lo que le causa erupcin. Escriba los siguientes datos: ? Lo que come. ? Los cosmticos que utiliza. ? Lo que bebe. ? La ropa que usa. Esto incluye las alhajas.  Concurra a todas las visitas de control como se lo haya  indicado el mdico. Esto es importante. SOLICITE ATENCIN MDICA SI:  Transpira de noche.  Pierde peso.  Orina ms de lo normal.  Se siente dbil.  Vomita.  Tiene un color amarillo en la piel o en la zona blanca del ojo (ictericia).  La piel: ? Siente hormigueos. ? Se adormece.  La erupcin: ? No desaparece despus de varios das. ? Empeora.  Usted: ? Est inusualmente sediento. ? Est ms cansado que lo habitual.  Tiene los siguientes sntomas: ? Sntomas nuevos. ? Dolor en el abdomen. ? Fiebre. ? Diarrea.  SOLICITE ATENCIN MDICA DE INMEDIATO SI:  Presenta una erupcin que cubre todo el cuerpo o la mayor parte de este. La erupcin puede ser dolorosa o no.  Tiene ampollas que tienen las siguientes caractersticas: ? Se ubican sobre la erupcin. ? Se agrandan o crecen juntas. ? Son dolorosas. ? Estn dentro de la nariz o la boca.  Le aparece una erupcin que tiene las siguientes caractersticas: ? Tiene pequeas manchas moradas, como si fueran pinchazos, en todo el cuerpo. ? Tiene un aspecto parecido a una diana o a un blanco de tiro. ? No est relacionada con una exposicin al sol, est enrojecida y duele, y produce descamacin de la piel.  Esta informacin no tiene como fin reemplazar el consejo del mdico. Asegrese de hacerle al mdico cualquier pregunta que tenga. Document Released: 03/26/2005 Document Revised: 10/08/2015 Document Reviewed: 11/01/2014 Elsevier Interactive Patient Education  2017 Elsevier Inc.     IF you received an x-ray today, you will receive an invoice from [facility]. Please contact [facility] at   [phone] with questions or concerns regarding your invoice.   IF you received labwork today, you will receive an invoice from [facility]. Please contact [facility] at [phone] with questions or concerns regarding your invoice.   Our billing staff will not be able to assist you with questions regarding bills from  these companies.  You will be contacted with the lab results as soon as they are available. The fastest way to get your results is to activate your My Chart account. Instructions are located on the last page of this paperwork. If you have not heard from us regarding the results in 2 weeks, please contact this office.      

## 2017-07-29 ENCOUNTER — Other Ambulatory Visit: Payer: Self-pay | Admitting: Family Medicine

## 2017-07-29 DIAGNOSIS — Z1231 Encounter for screening mammogram for malignant neoplasm of breast: Secondary | ICD-10-CM

## 2017-08-14 ENCOUNTER — Ambulatory Visit
Admission: RE | Admit: 2017-08-14 | Discharge: 2017-08-14 | Disposition: A | Discharge status: Home / Self Care | Payer: BLUE CROSS/BLUE SHIELD | Source: Ambulatory Visit | Attending: Family Medicine | Admitting: Family Medicine

## 2017-08-14 DIAGNOSIS — Z1231 Encounter for screening mammogram for malignant neoplasm of breast: Secondary | ICD-10-CM

## 2017-09-21 ENCOUNTER — Encounter (HOSPITAL_COMMUNITY): Payer: Self-pay | Admitting: Family Medicine

## 2017-09-21 ENCOUNTER — Ambulatory Visit (INDEPENDENT_AMBULATORY_CARE_PROVIDER_SITE_OTHER): Payer: BLUE CROSS/BLUE SHIELD

## 2017-09-21 ENCOUNTER — Ambulatory Visit (HOSPITAL_COMMUNITY)
Admission: EM | Admit: 2017-09-21 | Discharge: 2017-09-21 | Disposition: A | Discharge status: Home / Self Care | Payer: BLUE CROSS/BLUE SHIELD | Attending: Urgent Care | Admitting: Urgent Care

## 2017-09-21 DIAGNOSIS — M7711 Lateral epicondylitis, right elbow: Secondary | ICD-10-CM | POA: Diagnosis not present

## 2017-09-21 DIAGNOSIS — M25521 Pain in right elbow: Secondary | ICD-10-CM

## 2017-09-21 MED ORDER — CELECOXIB 100 MG PO CAPS: 100.0000 mg | ORAL_CAPSULE | Freq: Two times a day (BID) | ORAL | 0 refills | Status: DC

## 2017-09-21 NOTE — ED Provider Notes (Signed)
  MRN: [id number] DOB: 10/06/1965  Subjective:   [name] is a 52 y.o. female presenting for ~2 week history of right elbow pain, swelling. Has used exercises for epicondylitis. She was prescribed meloxicam for her symptoms by her PCP. However, she had hives and also did not feel relief with the medication. Tried APAP, Advil without any relief of her symptoms. Denies trauma, falls, swelling, warmth, history of arthritis, RA, fever. Denies smoking cigarettes or drinking alcohol.   No current facility-administered medications for this encounter.   Current Outpatient Medications:  .  cetirizine (ZYRTEC) 10 MG tablet, Take 1 tablet (10 mg total) by mouth daily., Disp: 30 tablet, Rfl: 11 .  cyclobenzaprine (FLEXERIL) 10 MG tablet, Take 0.5-1 tablets (5-10 mg total) by mouth 3 (three) times daily as needed for muscle spasms., Disp: 60 tablet, Rfl: 0 .  hydrOXYzine (VISTARIL) 50 MG capsule, Take 1 capsule (50 mg total) by mouth 3 (three) times daily as needed., Disp: 30 capsule, Rfl: 0 .  ranitidine (ZANTAC) 150 MG tablet, Take 1 tablet (150 mg total) by mouth 2 (two) times daily., Disp: 60 tablet, Rfl: 0   Allergies  Allergen Reactions  . Meloxicam Hives    Past Medical History:  Diagnosis Date  . Allergy 2001  . Hyperlipidemia   . Migraine 2015   previously treated with propranolol   . Shingles 07/07/2012    Past Surgical History:  Procedure Laterality Date  . TUBAL LIGATION  1997    Objective:   Vitals: BP (!) 130/58   Pulse 70   Temp 98.6 F (37 C)   Resp 18   SpO2 100%   Physical Exam  Constitutional: She is oriented to person, place, and time. She appears well-developed and well-nourished.  Cardiovascular: Normal rate.  Pulmonary/Chest: Effort normal.  Musculoskeletal:       Right elbow: Tenderness found. Medial epicondyle (mild) and lateral epicondyle (significant more tender than medial epicondyle, also elicited with pronation and supination of hand) tenderness  noted. No radial head and no olecranon process tenderness noted.       Right forearm: She exhibits no tenderness, no bony tenderness, no swelling, no edema, no deformity and no laceration.  Neurological: She is alert and oriented to person, place, and time.   Dg Elbow Complete Right  Result Date: 09/21/2017 CLINICAL DATA:  Elbow pain for 2 weeks EXAM: RIGHT ELBOW - COMPLETE 3+ VIEW COMPARISON:  None. FINDINGS: There is no evidence of fracture, dislocation, or joint effusion. There is no evidence of arthropathy or other focal bone abnormality. Soft tissues are unremarkable. IMPRESSION: Negative. Electronically Signed   By: Jasmine PangKim  Fujinaga M.D.   On: 09/21/2017 17:40   Assessment and Plan :   Lateral epicondylitis of right elbow  Right elbow pain  It is unclear if patient has allergy to meloxicam, states that she has taken without any issues before but very clearly associated hives with her most recent use. Will start trial of celecoxib, modification of activities for management of lateral epicondylitis. Counseled patient on potential for adverse effects with medications prescribed today, patient verbalized understanding. Return-to-clinic precautions discussed, patient verbalized understanding.    Wallis BambergMani, Chesnie Capell, PA-C 09/21/17 1800

## 2017-09-21 NOTE — ED Triage Notes (Signed)
Pt here for right arm pain x 2 week. Reports that it started in her elbow and now radiating up arm. Taking tylenol and advil but not helping.

## 2017-09-21 NOTE — Discharge Instructions (Signed)
Tome 500mg de Tylenol cada 6 horas con comida para dolor y inflammacion. °

## 2017-11-27 ENCOUNTER — Other Ambulatory Visit (HOSPITAL_COMMUNITY)
Admission: RE | Admit: 2017-11-27 | Discharge: 2017-11-27 | Disposition: A | Discharge status: Home / Self Care | Payer: BLUE CROSS/BLUE SHIELD | Source: Ambulatory Visit | Attending: Family Medicine | Admitting: Family Medicine

## 2017-11-27 ENCOUNTER — Other Ambulatory Visit: Payer: Self-pay | Admitting: Family Medicine

## 2017-11-27 DIAGNOSIS — Z01411 Encounter for gynecological examination (general) (routine) with abnormal findings: Secondary | ICD-10-CM | POA: Diagnosis present

## 2017-12-01 LAB — CYTOLOGY - PAP
DIAGNOSIS: NEGATIVE
HPV (WINDOPATH): NOT DETECTED

## 2017-12-10 ENCOUNTER — Encounter: Payer: Self-pay | Admitting: Urgent Care

## 2017-12-10 ENCOUNTER — Ambulatory Visit: Payer: BLUE CROSS/BLUE SHIELD | Admitting: Urgent Care

## 2017-12-10 VITALS — BP 122/66 | HR 72 | Temp 98.2°F | Resp 16 | Ht 63.0 in | Wt 184.0 lb

## 2017-12-10 DIAGNOSIS — N12 Tubulo-interstitial nephritis, not specified as acute or chronic: Secondary | ICD-10-CM

## 2017-12-10 DIAGNOSIS — N3090 Cystitis, unspecified without hematuria: Secondary | ICD-10-CM | POA: Diagnosis not present

## 2017-12-10 DIAGNOSIS — R102 Pelvic and perineal pain: Secondary | ICD-10-CM

## 2017-12-10 DIAGNOSIS — R350 Frequency of micturition: Secondary | ICD-10-CM

## 2017-12-10 DIAGNOSIS — R300 Dysuria: Secondary | ICD-10-CM

## 2017-12-10 DIAGNOSIS — Z113 Encounter for screening for infections with a predominantly sexual mode of transmission: Secondary | ICD-10-CM

## 2017-12-10 LAB — POCT URINALYSIS DIP (MANUAL ENTRY)
BILIRUBIN UA: NEGATIVE
Blood, UA: NEGATIVE
Glucose, UA: NEGATIVE mg/dL
Ketones, POC UA: NEGATIVE mg/dL
Leukocytes, UA: NEGATIVE
NITRITE UA: NEGATIVE
Protein Ur, POC: NEGATIVE mg/dL
Spec Grav, UA: 1.01 (ref 1.010–1.025)
Urobilinogen, UA: 0.2 E.U./dL
pH, UA: 7 (ref 5.0–8.0)

## 2017-12-10 LAB — POC MICROSCOPIC URINALYSIS (UMFC): Mucus: ABSENT

## 2017-12-10 MED ORDER — CEFTRIAXONE SODIUM 1 G IJ SOLR
1.0000 g | Freq: Once | INTRAMUSCULAR | Status: AC
  Administered 2017-12-10: 1 g via INTRAMUSCULAR

## 2017-12-10 MED ORDER — NITROFURANTOIN MONOHYD MACRO 100 MG PO CAPS: 100.0000 mg | ORAL_CAPSULE | Freq: Two times a day (BID) | ORAL | 0 refills | Status: DC

## 2017-12-10 NOTE — Patient Instructions (Addendum)
Pielonefritis en los adultos (Pyelonephritis, Adult) La pielonefritis es una infeccin del rin. Los riones son los rganos que filtran la sangre y Cardinal Healtheliminan los residuos del torrente sanguneo a travs de la orina. La orina pasa desde los riones, a travs de los urteres, Wellsite geologisthacia la vejiga. Hay dos tipos principales de pielonefritis:  Infecciones que se inician rpidamente sin sntomas previos (pielonefritis aguda).  Infecciones que persisten durante un perodo prolongado (pielonefritis crnica). En la International Business Machinesmayora de los casos, la infeccin desaparece con el tratamiento y no causa otros problemas. Las infecciones ms graves o crnicas a veces pueden propagarse al torrente sanguneo u ocasionar otros problemas en los riones. CAUSAS Por lo general, entre las causas de esta afeccin, se incluyen las siguientes:  Bacterias que pasan desde la vejiga al rin a travs de la orina infectada. La orina de la vejiga puede infectarse por bacterias relacionadas con estas causas: ? Infeccin en la vejiga (cistitis). ? Inflamacin de la prstata (prostatitis). ? Relaciones sexuales en las mujeres.  Bacterias que pasan del torrente sanguneo al rin. FACTORES DE RIESGO Es ms probable que esta afeccin se manifieste en:  Las embarazadas.  Las personas de edad avanzada.  Los diabticos.  Las personas que tienen clculos en los riones o la vejiga.  Las personas que tienen otras anomalas en el rin o los urteres.  Las personas que tienen una sonda vesical.  Las personas con Database administratorcncer.  Las personas que son sexualmente activas.  Las mujeres que usan espermicidas.  Las personas que han tenido una infeccin previa en las vas Rowenaurinarias. SNTOMAS Los sntomas de esta afeccin incluyen lo siguiente:  Ganas frecuentes de Geographical information systems officerorinar.  Necesidad intensa o persistente de Geographical information systems officerorinar.  Sensacin de ardor o escozor al ConocoPhillipsorinar.  Dolor abdominal.  Dolor de espalda.  Dolor al costado del cuerpo o en la  fosa lumbar.  Grant RutsFiebre.  Escalofros.  Sangre en la Comorosorina u [place] Islandsorina oscura.  Nuseas.  Vmitos. DIAGNSTICO Esta afeccin se puede diagnosticar en funcin de lo siguiente:  Examen fsico e historia clnica.  Anlisis de Comor[place]orina.  Anlisis de [place] Hod[place]angre. Tambin pueden Constellation Energyhacerle estudios de diagnstico por imgenes de los riones, por Tryonejemplo, una ecografa o una tomografa computarizada. TRATAMIENTO El tratamiento de esta afeccin puede depender de la gravedad de la infeccin.  Si la infeccin es leve y se detecta rpidamente, pueden administrarle antibiticos por va oral. Deber tomar lquido para permanecer hidratado.  Si la infeccin es ms grave, es posible que deban hospitalizarlo para administrarle antibiticos directamente en una vena a travs de una va intravenosa (IV). Quizs tambin deban administrarle lquidos a travs de una va intravenosa si no se encuentra bien hidratado. Despus de la hospitalizacin, es posible que deba tomar antibiticos durante un Monteguttiempo. Podrn prescribirle otros tratamientos segn la causa de la infeccin. INSTRUCCIONES PARA EL CUIDADO EN EL HOGAR Medicamentos  Baxter Internationalome los medicamentos de venta libre y los recetados solamente como se lo haya indicado el mdico.  Si le recetaron un antibitico, tmelo como se lo haya indicado el mdico. No deje de tomar los antibiticos aunque comience a Actorsentirse mejor. Instrucciones generales  Beba suficiente lquido para mantener la orina clara o de color amarillo plido.  Evite la cafena, el t y las 250 Hospital Placebebidas gaseosas. Estas sustancias irritan la vejiga.  Orine con frecuencia. Evite retener la orina durante largos perodos.  Orine antes y despus de las The St. Paul Travelersrelaciones sexuales.  Despus de defecar, las mujeres deben higienizarse la regin perineal desde adelante hacia atrs. Use cada trozo de  papel higinico solo Lowe[employer] Companies.  Concurra a todas las visitas de control como se lo haya indicado el mdico. Esto es  importante. SOLICITE ATENCIN MDICA SI:  Los sntomas no mejoran despus de 2das de tratamiento.  Los sntomas empeoran.  Tiene fiebre. SOLICITE ATENCIN MDICA DE INMEDIATO SI:  No puede tomar los antibiticos ni ingerir lquidos.  Comienza a sentir escalofros.  Vomita.  Siente un dolor intenso en la espalda o en la fosa lumbar.  Se desmaya o siente una debilidad extrema. Esta informacin no tiene Theme park manager el consejo del mdico. Asegrese de hacerle al mdico cualquier pregunta que tenga. Document Released: 03/26/2005 Document Revised: 03/07/2015 Document Reviewed: 10/09/2014 Elsevier Interactive Patient Education  2018 [employer].    IF you received an x-ray today, you will receive an invoice from [facility]. Please contact [facility] at [phone] with questions or concerns regarding your invoice.   IF you received labwork today, you will receive an invoice from [facility]. Please contact [facility] at [phone] with questions or concerns regarding your invoice.   Our billing staff will not be able to assist you with questions regarding bills from these companies.  You will be contacted with the lab results as soon as they are available. The fastest way to get your results is to activate your My Chart account. Instructions are located on the last page of this paperwork. If you have not heard from [place] regarding the results in 2 weeks, please contact this office.

## 2017-12-10 NOTE — Progress Notes (Signed)
MRN: [id number] DOB: 1966-06-06  Subjective:   Rachel Newton is a 52 y.o. female presenting for 1 week history of persistent dysuria, urinary frequency, urgency while having pelvic discomfort, belly bloating and flank pain worse on the left side.  Patient was seen by her gynecologist and had a Pap smear done a couple weeks ago, states that it was normal but was told that she had a urinary tract infection.  She was prescribed ciprofloxacin and states that she did okay with the medicine but did not resolve her symptoms.  Today she reports that she is very concerned about having an STI and would like testing for this.  She has a history of antibiotic resistant UTIs.  Denies fever, nausea, vomiting, vaginal discharge, genital rashes, hematuria.  She hydrates very well every day with at least 6 bottles of water.  Rachel Newton has a current medication list which includes the following prescription(s): celecoxib, cetirizine, cyclobenzaprine, hydroxyzine, and ranitidine. Patient is allergic to meloxicam.  Rachel Newton  has a past medical history of Allergy (2001), Hyperlipidemia, Migraine (2015), and Shingles (07/07/2012). Also  has a past surgical history that includes Tubal ligation (1997 ).  Objective:   Vitals: BP 122/66   Pulse 72   Temp 98.2 F (36.8 C) (Oral)   Resp 16   Ht 5\' 3"  (1.6 m)   Wt 184 lb (83.5 kg)   SpO2 100%   BMI 32.59 kg/m   Physical Exam  Constitutional: She is oriented to person, place, and time. She appears well-developed and well-nourished.  HENT:  Mouth/Throat: Oropharynx is clear and moist.  Eyes: No scleral icterus.  Cardiovascular: Normal rate, regular rhythm and intact distal pulses. Exam reveals no gallop and no friction rub.  No murmur heard. Pulmonary/Chest: No respiratory distress. She has no wheezes. She has no rales.  Abdominal: Soft. Bowel sounds are normal. She exhibits no distension and no mass. There is tenderness. There is no rebound and no guarding.    Left-sided CVA tenderness, pelvic tenderness.  Musculoskeletal: She exhibits no edema.  Neurological: She is alert and oriented to person, place, and time.  Skin: Skin is warm and dry. No rash noted. No erythema. No pallor.  Psychiatric:  Anxious demeanor.    Results for orders placed or performed in visit on 12/10/17 (from the past 24 hour(s))  POCT urinalysis dipstick     Status: None   Collection Time: 12/10/17  2:30 PM  Result Value Ref Range   Color, UA yellow yellow   Clarity, UA clear clear   Glucose, UA negative negative mg/dL   Bilirubin, UA negative negative   Ketones, POC UA negative negative mg/dL   Spec Grav, UA 4.098 1.191 - 1.025   Blood, UA negative negative   pH, UA 7.0 5.0 - 8.0   Protein Ur, POC negative negative mg/dL   Urobilinogen, UA 0.2 0.2 or 1.0 E.U./dL   Nitrite, UA Negative Negative   Leukocytes, UA Negative Negative  POCT Microscopic Urinalysis ([facility])     Status: None   Collection Time: 12/10/17  2:39 PM  Result Value Ref Range   WBC,UR,HPF,POC None None WBC/hpf   RBC,UR,HPF,POC None None RBC/hpf   Bacteria None None, Too numerous to count   Mucus Absent Absent   Epithelial Cells, UR Per Microscopy None None, Too numerous to count cells/hpf    Assessment and Plan :   Pyelonephritis  Cystitis - Plan: cefTRIAXone (ROCEPHIN) injection 1 g  Burning with urination - Plan: POCT urinalysis dipstick,  POCT Microscopic Urinalysis ([facility]), Urine Culture, cefTRIAXone (ROCEPHIN) injection 1 g  Pelvic pain in female - Plan: Wet prep, genital, cefTRIAXone (ROCEPHIN) injection 1 g  Urinary frequency - Plan: Hemoglobin A1c, Comprehensive metabolic panel  Routine screening for STI (sexually transmitted infection) - Plan: HIV antibody, RPR, Trichomonas vaginalis, RNA, GC/Chlamydia Probe Amp([facility])  Patient received IM ceftriaxone in clinic to address pyelonephritis.  Prescribed Macrobid based on the previous urine culture results that showed susceptibility  to this.  Patient is to maintain aggressive hydration.  Labs pending.  Patient is to follow-up with me in 2 days.  Wallis BambergMario Omega Slager, PA-C [facility] [phone] 12/10/2017 2:30 PM

## 2017-12-11 LAB — COMPREHENSIVE METABOLIC PANEL
ALBUMIN: 4.1 g/dL (ref 3.5–5.5)
ALT: 23 IU/L (ref 0–32)
AST: 19 IU/L (ref 0–40)
Albumin/Globulin Ratio: 1.6 (ref 1.2–2.2)
Alkaline Phosphatase: 90 IU/L (ref 39–117)
BUN / CREAT RATIO: 15 (ref 9–23)
BUN: 13 mg/dL (ref 6–24)
CHLORIDE: 105 mmol/L (ref 96–106)
CO2: 24 mmol/L (ref 20–29)
Calcium: 9.1 mg/dL (ref 8.7–10.2)
Creatinine, Ser: 0.88 mg/dL (ref 0.57–1.00)
GFR calc non Af Amer: 76 mL/min/{1.73_m2} (ref >59)
GFR, EST AFRICAN AMERICAN: 87 mL/min/{1.73_m2} (ref >59)
GLUCOSE: 102 mg/dL — AB (ref 65–99)
Globulin, Total: 2.5 g/dL (ref 1.5–4.5)
Potassium: 4.3 mmol/L (ref 3.5–5.2)
Sodium: 141 mmol/L (ref 134–144)
TOTAL PROTEIN: 6.6 g/dL (ref 6.0–8.5)

## 2017-12-11 LAB — URINE CULTURE

## 2017-12-11 LAB — HEMOGLOBIN A1C
Est. average glucose Bld gHb Est-mCnc: 134 mg/dL
Hgb A1c MFr Bld: 6.3 % — ABNORMAL HIGH (ref 4.8–5.6)

## 2017-12-11 LAB — RPR: RPR Ser Ql: NONREACTIVE

## 2017-12-11 LAB — HIV ANTIBODY (ROUTINE TESTING W REFLEX): HIV SCREEN 4TH GENERATION: NONREACTIVE

## 2017-12-12 ENCOUNTER — Ambulatory Visit (INDEPENDENT_AMBULATORY_CARE_PROVIDER_SITE_OTHER): Payer: BLUE CROSS/BLUE SHIELD

## 2017-12-12 ENCOUNTER — Encounter: Payer: Self-pay | Admitting: Urgent Care

## 2017-12-12 ENCOUNTER — Ambulatory Visit: Payer: BLUE CROSS/BLUE SHIELD | Admitting: Urgent Care

## 2017-12-12 ENCOUNTER — Other Ambulatory Visit: Payer: Self-pay

## 2017-12-12 VITALS — BP 120/68 | HR 70 | Temp 97.5°F | Resp 16 | Ht 60.5 in | Wt 183.8 lb

## 2017-12-12 DIAGNOSIS — M2550 Pain in unspecified joint: Secondary | ICD-10-CM | POA: Diagnosis not present

## 2017-12-12 DIAGNOSIS — N898 Other specified noninflammatory disorders of vagina: Secondary | ICD-10-CM

## 2017-12-12 DIAGNOSIS — M545 Low back pain, unspecified: Secondary | ICD-10-CM

## 2017-12-12 LAB — TRICHOMONAS VAGINALIS, PROBE AMP: TRICH VAG BY NAA: NEGATIVE

## 2017-12-12 LAB — GC/CHLAMYDIA PROBE AMP
Chlamydia trachomatis, NAA: NEGATIVE
NEISSERIA GONORRHOEAE BY PCR: NEGATIVE

## 2017-12-12 MED ORDER — FLUCONAZOLE 150 MG PO TABS: 150.0000 mg | ORAL_TABLET | ORAL | 0 refills | Status: DC

## 2017-12-12 MED ORDER — CYCLOBENZAPRINE HCL 5 MG PO TABS: 5.0000 mg | ORAL_TABLET | Freq: Every day | ORAL | 0 refills | Status: DC

## 2017-12-12 MED ORDER — NAPROXEN SODIUM 550 MG PO TABS: 550.0000 mg | ORAL_TABLET | Freq: Two times a day (BID) | ORAL | 1 refills | Status: DC

## 2017-12-12 NOTE — Progress Notes (Signed)
    MRN: [id number] DOB: 10-Oct-1965  Subjective:   Rachel SitesMaria C Newton is a 52 y.o. female presenting for follow up on ongoing back pain, vaginal discomfort, vaginal itching and urinary frequency.  At her last office visit on 12/10/2017, I provided patient with IM ceftriaxone and Macrobid to address to pyelonephritis as an outpatient.  The dysuria has improved but she continues to have vaginal itching.  All of her testing was equivocal except for prediabetes.  Counseled that we should try to use Diflucan in the off chance that it actually helps her with a yeast infection.  Recommended she continue to hydrate aggressively with at least 1 gallon of water per day.  Patient reports that her diet is carbohydrate heavy.  Denies alcohol use.  Denies fever, nausea, vomiting, flank pain.  Denies hematuria, constipation, bloody stools.  She does admit a history of persistent multiple joint pain.  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  She has previously used ibuprofen and Advil without any issues, reports that she had an allergic reaction with just meloxicam but never other medications for pain and inflammation.  Rachel Newton has a current medication list which includes the following prescription(s): cetirizine, nitrofurantoin (macrocrystal-monohydrate), and ranitidine. Also is allergic to meloxicam.  Rachel Newton  has a past medical history of Allergy (2001), Hyperlipidemia, Migraine (2015), and Shingles (07/07/2012). Also  has a past surgical history that includes Tubal ligation (1997 ).  Objective:   Vitals: BP 120/68 (BP Location: Left Arm, Patient Position: Sitting, Cuff Size: Normal)   Pulse 70   Temp (!) 97.5 F (36.4 C) (Oral)   Resp 16   Ht 5' 0.5" (1.537 m)   Wt 183 lb 12.8 oz (83.4 kg)   SpO2 98%   BMI 35.31 kg/m   Physical Exam  Constitutional: She is oriented to person, place, and time. She appears well-developed and well-nourished.  Cardiovascular: Normal rate.    Pulmonary/Chest: Effort normal.  Neurological: She is alert and oriented to person, place, and time.  Psychiatric: She has a normal mood and affect.   Assessment and Plan :   Acute bilateral low back pain without sciatica - Plan: DG Lumbar Spine Complete  Multiple joint pain  Vaginal itching  We will try Diflucan to address the vaginal itching as a yeast infection despite negative testing.  Given her history of multiple joint pain, will pursue back x-ray to rule out degenerative disc disease or other skeletal issue.  In the meantime patient will try naproxen over the next couple of weeks with Flexeril. Counseled patient on potential for adverse effects with medications prescribed today, patient verbalized understanding. Return-to-clinic precautions discussed, patient verbalized understanding.   Wallis BambergMario Thomos Domine, PA-C [facility] [phone] 12/12/2017 10:30 AM

## 2017-12-12 NOTE — Patient Instructions (Addendum)
Dolor de espalda en adultos Back Pain, Adult Muchos adultos sufren dolor de espalda de vez en cuando. Las causas comunes del dolor de espalda son las siguientes:  Una distensin del msculo o el ligamento.  Desgaste (degeneracin) de los discos vertebrales.  Artritis.  Un golpe en la espalda.  El dolor de espalda puede ser breve (agudo) o prolongado (crnico). Es posible que se realicen un examen fsico, anlisis de laboratorio u otros estudios de diagnstico por imgenes para encontrar la causa del dolor. Siga estas indicaciones en su casa: Control del dolor y de la rigidez  Tome los medicamentos de venta libre y los recetados solamente como se lo haya indicado el mdico.  Si se lo indican, aplique calor en la zona afectada con la frecuencia que le haya indicado el mdico. Use la fuente de calor que el mdico le recomiende, como una compresa de calor hmedo o una almohadilla trmica. ? Colquese una toalla entre la piel y la fuente de calor. ? Aplique el calor durante 20 a 30minutos. ? Retire la fuente de calor si la piel se le pone de color rojo brillante. Esto es muy importante si no puede sentir el dolor, el calor ni el fro. Corre un mayor riesgo de sufrir quemaduras.  Si se lo indican, aplique hielo sobre la zona lesionada: ? Ponga el hielo en una bolsa plstica. ? Coloque una toalla entre la piel y la bolsa de hielo. ? Deje el hielo durante 20minutos, de 2 a 3veces por da, durante los primeros 2 o 3das. Actividad  No permanezca en la cama. Si hace reposo ms de 1 a 2 das, puede demorar su recuperacin.  Realice caminatas cortas en superficies planas tan pronto como le sea posible. Trate de caminar un poco ms de tiempo cada da.  No se siente, conduzca o permanezca de pie en un mismo lugar durante ms de 30 minutos seguidos. Pararse o sentarse durante largos perodos de tiempo puede sobrecargar la espalda.  Use tcnicas apropiadas para levantar objetos. Cuando se  inclina y levanta un objeto, utilice posiciones que no sobrecarguen tanto la espalda: ? Flexione las rodillas. ? Mantenga la carga cerca del cuerpo. ? No se tuerza.  Haga actividad fsica habitualmente como se lo haya indicado el mdico. Hacer ejercicio ayudar a que su espalda se cure ms rpido. Adems, esto ayuda a prevenir lesiones de espalda al mantener los msculos fuertes y flexibles.  El mdico puede recomendarle que consulte a un fisioterapeuta. Esta persona puede ayudarlo a elaborar un programa de ejercicios seguro. Haga ejercicios como se lo haya indicado el fisioterapeuta. Estilo de vida  Mantenga un peso saludable. El sobrepeso sobrecarga la espalda y hace que resulte difcil tener una buena postura.  Evite actividades o situaciones que lo hagan sentirse ansioso o estresado. Aprenda maneras de manejar la ansiedad y el estrs. Una forma de controlar el estrs es a travs del ejercicio. El estrs y la ansiedad aumentan la tensin muscular y pueden empeorar el dolor de espalda. Instrucciones generales  Duerma sobre un colchn firme en una posicin cmoda. Intente acostarse de costado, con las rodillas ligeramente flexionadas. Si se recuesta sobre la espalda, coloque una almohada debajo de las rodillas.  Siga el plan de tratamiento como se lo haya indicado el mdico. Esto puede incluir lo siguiente: ? Terapia cognitiva o conductual. ? Acupuntura o terapia de masajes. ? Yoga o meditacin. Comunquese con un mdico si:  Siente un dolor que no se alivia con reposo o medicamentos.    Siente mucho dolor que se extiende a las piernas o las nalgas.  El dolor no mejora en 2 semanas.  Siente dolor por la noche.  Pierde peso.  Tiene fiebre o siente escalofros. Solicite ayuda de inmediato si:  Tiene nuevos problemas para controlar la vejiga o los intestinos.  Siente debilidad o adormecimiento inusuales en los brazos o en las piernas.  Siente nuseas o vmitos.  Siente dolor  abdominal.  Siente que va a desmayarse. Resumen  Muchos adultos sufren dolor de espalda de vez en cuando. Es posible que se realicen un examen fsico, anlisis de laboratorio u otros estudios de diagnstico por imgenes para encontrar la causa del dolor.  Use tcnicas apropiadas para levantar objetos. Cuando se inclina y levanta un objeto, utilice posiciones que no sobrecarguen tanto la espalda.  Tome los medicamentos de venta libre o recetados y aplique calor o hielo solamente como se lo haya indicado el mdico. Esta informacin no tiene como fin reemplazar el consejo del mdico. Asegrese de hacerle al mdico cualquier pregunta que tenga. Document Released: 06/16/2005 Document Revised: 10/06/2016 Document Reviewed: 10/06/2016 Elsevier Interactive Patient Education  2018 Elsevier Inc.     IF you received an x-ray today, you will receive an invoice from [facility]. Please contact [facility] at [phone] with questions or concerns regarding your invoice.   IF you received labwork today, you will receive an invoice from [facility]. Please contact [facility] at [phone] with questions or concerns regarding your invoice.   Our billing staff will not be able to assist you with questions regarding bills from these companies.  You will be contacted with the lab results as soon as they are available. The fastest way to get your results is to activate your My Chart account. Instructions are located on the last page of this paperwork. If you have not heard from us regarding the results in 2 weeks, please contact this office.     

## 2017-12-16 LAB — TRICHOMONAS CULTURE

## 2017-12-16 LAB — WET PREP, GENITAL
Clue Cell Exam: NEGATIVE
TRICHOMONAS EXAM: NEGATIVE
YEAST EXAM: NEGATIVE

## 2018-03-24 ENCOUNTER — Other Ambulatory Visit: Payer: Self-pay

## 2018-03-24 ENCOUNTER — Ambulatory Visit: Payer: BLUE CROSS/BLUE SHIELD | Admitting: Urgent Care

## 2018-03-24 ENCOUNTER — Encounter: Payer: Self-pay | Admitting: Urgent Care

## 2018-03-24 VITALS — BP 122/77 | HR 85 | Temp 98.0°F | Resp 17 | Ht 60.5 in | Wt 182.0 lb

## 2018-03-24 DIAGNOSIS — R5383 Other fatigue: Secondary | ICD-10-CM

## 2018-03-24 DIAGNOSIS — Z8669 Personal history of other diseases of the nervous system and sense organs: Secondary | ICD-10-CM

## 2018-03-24 DIAGNOSIS — R112 Nausea with vomiting, unspecified: Secondary | ICD-10-CM | POA: Diagnosis not present

## 2018-03-24 DIAGNOSIS — R519 Headache, unspecified: Secondary | ICD-10-CM

## 2018-03-24 DIAGNOSIS — R197 Diarrhea, unspecified: Secondary | ICD-10-CM

## 2018-03-24 DIAGNOSIS — R52 Pain, unspecified: Secondary | ICD-10-CM | POA: Diagnosis not present

## 2018-03-24 DIAGNOSIS — R51 Headache: Secondary | ICD-10-CM

## 2018-03-24 NOTE — Patient Instructions (Addendum)
Tome 500mg  de Tylenol con ibuprofen 400-600mg  cada 6 horas con comida para dolor y inflammacion.     If you have lab work done today you will be contacted with your lab results within the next 2 weeks.  If you have not heard from [place] then please contact us. The fastest way to get your results is to register for My Chart.    Dolor de cabeza general sin causa General Headache Without Cause El dolor de cabeza es un dolor o malestar que se siente en la zona de la cabeza o del cuello. Puede no tener una causa especfica. Hay muchas causas y tipos de dolores de [place]. Los dolores de cabeza ms comunes son los siguientes:  Cefalea tensional.  Cefaleas migraosas.  Cefalea en brotes.  Cefaleas diarias crnicas.  Siga estas indicaciones en su casa:  Controle su afeccin para detectar cualquier cambio. Siga estos pasos para Facilities manager afeccin: Control del [employer] medicamentos de venta libre y los recetados solamente como se lo haya indicado el mdico.  Cuando sienta dolor de cabeza acustese en un cuarto oscuro y tranquilo.  Si se lo indican, aplique hielo sobre la cabeza y la zona del cuello: ? Ponga el hielo en una bolsa plstica. ? Coloque una [employer] piel y la bolsa de hielo. ? Deje el hielo durante , 2 a 3veces por da.  Utilice una almohadilla trmica o tome una ducha con agua caliente para aplicar calor en la cabeza y la zona del cuello como se lo haya indicado el mdico.  Mantenga las luces tenues si las luces brillantes le [place] o sus dolores de cabeza empeoran. Qu debe comer y beber  Mantenga un horario para las comidas.  Limite el consumo de bebidas alcohlicas.  Consuma menos cantidad de cafena o deje de tomarla. Instrucciones generales  Concurra a todas las visitas de seguimiento como se lo haya indicado el mdico. Esto es importante.  Lleve un diario de los dolores de cabeza para Financial risk analyst qu factores pueden desencadenarlos.  Por ejemplo, escriba: ? Lo que usted come y bebe. ? Cunto tiempo duerme. ? Algn cambio en su dieta o en los medicamentos.  Pruebe algunas tcnicas de relajacin, como los [place].  Limite el estrs.  Sintese con la espalda recta y no tense los msculos.  No consuma productos que contengan tabaco, incluidos cigarrillos, tabaco de Theatre manager o cigarrillos electrnicos. Si necesita ayuda para dejar de fumar, consulte al mdico.  Haga actividad fsica habitualmente como se lo haya indicado el mdico.  Tenga un horario fijo para dormir. Duerma entre 7 y 9horas o la cantidad de horas que le haya recomendado el mdico. Comunquese con un mdico si:  Los medicamentos no Materials engineer los sntomas.  Tiene un dolor de cabeza que es diferente del dolor de cabeza habitual.  Tiene nuseas o vmitos.  Tiene fiebre. Solicite ayuda de inmediato si:  El dolor se vuelve cada vez ms intenso.  Ha vomitado repetidas veces.  Presenta rigidez en el cuello.  Sufre prdida de la visin.  Tiene problemas para hablar.  Siente dolor en el ojo o en el odo.  Presenta debilidad muscular o prdida del control muscular.  Pierde el equilibrio o tiene problemas para Advertising account planner.  Sufre mareos o se desmaya.  Experimenta confusin. Esta informacin no tiene Theme park manager el consejo del mdico. Asegrese de hacerle al mdico cualquier pregunta que tenga. Document Released: 03/26/2005 Document Revised: 10/06/2016 Document Reviewed: 10/09/2014 Elsevier Interactive Patient  Education  2018 [employer].     Gastroenteritis viral en los adultos (Viral Gastroenteritis, Adult) La gastroenteritis viral tambin se conoce como gripe estomacal. Esta afeccin es el resultado de determinados grmenes (virus). Estos grmenes pueden transmitirse de Neomia Dear persona a otra con mucha facilidad (son muy contagiosos). Esta afeccin puede provocar heces lquidas (diarrea), fiebre y vmitos de forma repentina. La  diarrea y los vmitos pueden hacer que se sienta dbil y que se deshidrate. La deshidratacin puede hacerlo sentir cansado y sediento, producirle sequedad en la boca y disminuir la frecuencia con la que [place]. Los [employer] y las personas que tienen otras enfermedades o un sistema de defensa (sistema inmunitario) dbil tienen mayor riesgo de sufrir deshidratacin. Es importante restituir los lquidos que se pierden a causa de la diarrea y los vmitos. CUIDADOS EN EL HOGAR Siga las indicaciones del mdico sobre cmo debe cuidarse en su casa. Comida y bebida Siga estas indicaciones como se lo haya recomendado el mdico:  Tome una solucin de rehidratacin oral (SRO). Es Neomia Dear bebida que se vende en farmacias y tiendas.  Beba lquidos claros en pequeas cantidades segn le sea posible. Por ejemplo: ? [place]. ? Cubitos de hielo. ? Jugo de frutas diluido. ? Bebidas deportivas de bajas caloras.  En la medida en que pueda, consuma alimentos blandos y fciles de digerir en pequeas cantidades, por ejemplo, los siguientes: ? Bananas. ? Pur de [employer]. ? Arroz. ? Carnes bajas en grasa Nonah Mattes). ? Tostadas. ? Galletas.  Evite los lquidos con alto contenido de International aid/development worker o cafena.  Evite el alcohol.  Evite los alimentos picantes o grasos. Instrucciones generales  Beba suficiente lquido para mantener el pis (orina) claro o de color amarillo plido.  Lvese las manos con frecuencia. Use un desinfectante para manos si no dispone de [place] y [place].  Asegrese de que todas las personas que viven en su casa se laven bien las manos y con frecuencia.  Descanse en su casa hasta sentirse mejor.  Tome los medicamentos de venta libre y los recetados solamente como se lo haya indicado el mdico.  Controle su afeccin para ver si hay cambios.  Tome un bao caliente para ayudar a Engineer, manufacturing systems ardor o el dolor que produce la diarrea.  Concurra a todas las visitas de control como se lo haya indicado  el mdico. Esto es importante. SOLICITE AYUDA SI:  No puede retener los lquidos.  Los sntomas empeoran.  Aparecen nuevos sntomas.  Se siente mareado o siente que va a desvanecerse.  Tiene calambres musculares.  SOLICITE AYUDA DE INMEDIATO SI:  Siente dolor en el pecho.  Se siente muy dbil o se desvanece (se desmaya).  Observa sangre en el vmito.  El vmito se asemeja al poso del caf.  Tiene heces con sangre, de color negro, o con aspecto alquitranado.  Siente un dolor de cabeza intenso, rigidez en el cuello, o ambas cosas.  Tiene una erupcin cutnea.  Tiene dolor muy intenso en el vientre (abdomen), clicos o meteorismo.  Tiene dificultad para respirar.  Respira muy rpido.  Su corazn late muy rpidamente.  Siente la piel fra y hmeda.  Se siente confundido.  Siente dolor al [employer].  Tiene signos de deshidratacin, por ejemplo: ? [place] es [place], es muy escasa o no orina. ? Labios agrietados. ? [employer]. ? Ojos hundidos. ? Somnolencia. ? Debilidad.  Esta informacin no tiene Theme park manager el consejo del mdico. Asegrese de hacerle al mdico cualquier pregunta que tenga. Document  Released: 11/02/2008 Document Revised: 10/08/2015 Document Reviewed: 02/20/2015 Elsevier Interactive Patient Education  2017 [employer].     IF you received an x-ray today, you will receive an invoice from [facility]. Please contact [facility] at [phone] with questions or concerns regarding your invoice.   IF you received labwork today, you will receive an invoice from [place]. Please contact [facility] at [phone] with questions or concerns regarding your invoice.   Our billing staff will not be able to assist you with questions regarding bills from these companies.  You will be contacted with the lab results as soon as they are available. The fastest way to get your results is to activate your My Chart account. Instructions  are located on the last page of this paperwork. If you have not heard from [place] regarding the results in 2 weeks, please contact this office.

## 2018-03-24 NOTE — Progress Notes (Signed)
   MRN: [id number] DOB: 04-Sep-1965  Subjective:   Rachel Newton is a 52 y.o. female presenting for 3 day history of intermittent sharp transient severe pulsatile head pain. Has had multiple episodes lasting only a few seconds which were associated with nausea with vomiting, gagging that started Monday (2 days ago). Has also had body aches, diarrhea (now improved). Admits longstanding history of tingling of her hands, feeling that she is more forgetful. Both symptoms are unchanged since her new symptoms started 2 days ago. Has tried ibuprofen with some relief. Denies confusion, dizziness, fever, sore throat, cough, chest pain, belly pain. Denies smoking cigarettes. Denies alcohol use. Patient did stop taking hormone replacement ~1 month ago.   Truc has a current medication list which includes the following prescription(s): cetirizine, cyclobenzaprine, fluconazole, naproxen sodium, nitrofurantoin (macrocrystal-monohydrate), and ranitidine. Also is allergic to meloxicam.  Jaquana  has a past medical history of Allergy (2001), Hyperlipidemia, Migraine (2015), and Shingles (07/07/2012). Also  has a past surgical history that includes Tubal ligation (1997 ).  Objective:   Vitals: BP 122/77 (BP Location: Right Arm, Patient Position: Sitting, Cuff Size: Large)   Pulse 85   Temp 98 F (36.7 C) (Oral)   Resp 17   Ht 5' 0.5" (1.537 m)   Wt 182 lb (82.6 kg)   SpO2 97%   BMI 34.96 kg/m   Physical Exam  Constitutional: She is oriented to person, place, and time. She appears well-developed and well-nourished.  HENT:  Head: Head is without abrasion, without contusion and without laceration.  Mouth/Throat: Oropharynx is clear and moist.  Eyes: Pupils are equal, round, and reactive to light. EOM are normal. Right eye exhibits no discharge. Left eye exhibits no discharge. No scleral icterus.  Neck: Normal range of motion. Neck supple. No thyromegaly present.  Cardiovascular: Normal rate, regular rhythm,  normal heart sounds and intact distal pulses. Exam reveals no gallop and no friction rub.  No murmur heard. Pulmonary/Chest: Effort normal and breath sounds normal. No stridor. No respiratory distress. She has no wheezes. She has no rales.  Abdominal: Soft. Bowel sounds are normal. She exhibits no distension and no mass. There is no tenderness. There is no rebound and no guarding.  Musculoskeletal: She exhibits no edema.  Neurological: She is alert and oriented to person, place, and time. She displays normal reflexes. No cranial nerve deficit. Coordination normal.  Skin: Skin is warm and dry. No rash noted. No erythema. No pallor.  Psychiatric: She has a normal mood and affect.    Assessment and Plan :   Acute nonintractable headache, unspecified headache type - Plan: CBC, TSH, Sedimentation Rate  Nausea vomiting and diarrhea  Body aches  Other fatigue - Plan: TSH  History of migraine  Offered supportive care but she was agreeable to monitoring her symptoms, labs pending.  Her physical exam findings are very reassuring and I do not suspect an acute intracranial process.  I did counsel her that CT imaging may be in order if her symptoms persist.  Strict ER precautions and return to clinic precautions reviewed.  I offered her symptomatic relief for her nausea but patient declined this given that she is improving.  Wallis Bamberg, PA-C Primary Care at [facility] [phone] 03/24/2018  3:41 PM

## 2018-03-25 LAB — CBC
HEMOGLOBIN: 14.4 g/dL (ref 11.1–15.9)
Hematocrit: 43.2 % (ref 34.0–46.6)
MCH: 28.7 pg (ref 26.6–33.0)
MCHC: 33.3 g/dL (ref 31.5–35.7)
MCV: 86 fL (ref 79–97)
Platelets: 271 10*3/uL (ref 150–450)
RBC: 5.02 x10E6/uL (ref 3.77–5.28)
RDW: 13.1 % (ref 12.3–15.4)
WBC: 6.5 10*3/uL (ref 3.4–10.8)

## 2018-03-25 LAB — TSH: TSH: 1.34 u[IU]/mL (ref 0.450–4.500)

## 2018-03-25 LAB — SEDIMENTATION RATE: SED RATE: 3 mm/h (ref 0–40)

## 2019-02-19 ENCOUNTER — Other Ambulatory Visit: Payer: Self-pay

## 2019-02-19 DIAGNOSIS — Z20822 Contact with and (suspected) exposure to covid-19: Secondary | ICD-10-CM

## 2019-02-20 LAB — NOVEL CORONAVIRUS, NAA: SARS-CoV-2, NAA: NOT DETECTED

## 2019-03-17 ENCOUNTER — Other Ambulatory Visit: Payer: Self-pay

## 2019-03-17 ENCOUNTER — Ambulatory Visit (INDEPENDENT_AMBULATORY_CARE_PROVIDER_SITE_OTHER): Payer: Self-pay | Admitting: Family Medicine

## 2019-03-17 ENCOUNTER — Encounter: Payer: Self-pay | Admitting: Family Medicine

## 2019-03-17 VITALS — BP 118/72 | HR 74 | Temp 98.8°F | Ht 60.5 in | Wt 173.6 lb

## 2019-03-17 DIAGNOSIS — R350 Frequency of micturition: Secondary | ICD-10-CM

## 2019-03-17 LAB — POCT URINALYSIS DIP (MANUAL ENTRY)
Bilirubin, UA: NEGATIVE
Blood, UA: NEGATIVE
Glucose, UA: NEGATIVE mg/dL
Ketones, POC UA: NEGATIVE mg/dL
Nitrite, UA: NEGATIVE
Protein Ur, POC: NEGATIVE mg/dL
Spec Grav, UA: 1.025 (ref 1.010–1.025)
Urobilinogen, UA: 0.2 E.U./dL
pH, UA: 6.5 (ref 5.0–8.0)

## 2019-03-17 NOTE — Progress Notes (Signed)
9/17/202011:59 AM  Rachel SitesMaria C Newton 09-19-1965, 53 y.o., female [id number]  Chief Complaint  Patient presents with  . Urinary Urgency    thinks she may have uti    HPI:   Patient is a 53 y.o. female with past medical history significant for HLP who presents today for urinary urgency, dysuria, suprapubic pressure x 3-4 weeks  Denies any hematuria  Reports strong odor New job does not allow for many restroom breaks Postmenopausal Not sexually active Reports having a brownish foul smelling odor No fever or chills No nausea or vomiting Does not use vaginal douches nor washes   Depression screen [facility] 2/9 03/24/2018 12/12/2017 12/10/2017  Decreased Interest 1 0 0  Down, Depressed, Hopeless 1 0 0  PHQ - 2 Score 2 0 0  Altered sleeping 1 - -  Tired, decreased energy 1 - -  Change in appetite 1 - -  Feeling bad or failure about yourself  0 - -  Trouble concentrating 1 - -  Moving slowly or fidgety/restless 1 - -  Suicidal thoughts 0 - -  PHQ-9 Score 7 - -  Difficult doing work/chores Not difficult at all - -    Fall Risk  03/24/2018 12/12/2017 12/10/2017 04/24/2017 12/28/2015  Falls in the past year? No No No No No     Allergies  Allergen Reactions  . Meloxicam Hives    Prior to Admission medications   Medication Sig Start Date End Date Taking? Authorizing Provider  fluticasone (FLONASE) 50 MCG/ACT nasal spray Place into both nostrils daily.   Yes [provider]    Past Medical History:  Diagnosis Date  . Allergy 2001  . Hyperlipidemia   . Migraine 2015   previously treated with propranolol   . Shingles 07/07/2012    Past Surgical History:  Procedure Laterality Date  . TUBAL LIGATION  1997     Social History   Tobacco Use  . Smoking status: Never Smoker  . Smokeless tobacco: Never Used  Substance Use Topics  . Alcohol use: No    Family History  Problem Relation Age of Onset  . Hypertension Mother   . Cancer Neg Hx   . Heart disease Neg Hx    . Breast cancer Neg Hx     ROS Per hpi  OBJECTIVE:  Today's Vitals   03/17/19 1144  BP: 118/72  Pulse: 74  Temp: 98.8 F (37.1 C)  SpO2: 98%  Weight: 173 lb 9.6 oz (78.7 kg)  Height: 5' 0.5" (1.537 m)   Body mass index is 33.35 kg/m.   Physical Exam Vitals signs and nursing note reviewed. Exam conducted with a chaperone present.  Constitutional:      Appearance: She is well-developed.  HENT:     Head: Normocephalic and atraumatic.  Eyes:     General: No scleral icterus.    Conjunctiva/sclera: Conjunctivae normal.     Pupils: Pupils are equal, round, and reactive to light.  Neck:     Musculoskeletal: Neck supple.  Pulmonary:     Effort: Pulmonary effort is normal.  Genitourinary:    General: Normal vulva.     Labia:        Right: No rash or lesion.        Left: No rash or lesion.      Vagina: No vaginal discharge or lesions.     Cervix: No cervical motion tenderness, discharge, friability or lesion.     Uterus: Normal.      Adnexa: Right  adnexa normal and left adnexa normal.  Skin:    General: Skin is warm and dry.  Neurological:     Mental Status: She is alert and oriented to person, place, and time.     Results for orders placed or performed in visit on 03/17/19 (from the past 24 hour(s))  POCT urinalysis dipstick     Status: Abnormal   Collection Time: 03/17/19 11:53 AM  Result Value Ref Range   Color, UA yellow yellow   Clarity, UA clear clear   Glucose, UA negative negative mg/dL   Bilirubin, UA negative negative   Ketones, POC UA negative negative mg/dL   Spec Grav, UA 1.025 1.010 - 1.025   Blood, UA negative negative   pH, UA 6.5 5.0 - 8.0   Protein Ur, POC negative negative mg/dL   Urobilinogen, UA 0.2 0.2 or 1.0 E.U./dL   Nitrite, UA Negative Negative   Leukocytes, UA Trace (A) Negative    No results found.   ASSESSMENT and PLAN  1. Urinary frequency Push fluids, labs pending. RTC precautions reviewed. - POCT urinalysis dipstick -  Urine Culture - WET PREP FOR TRICH, YEAST, CLUE   Return if symptoms worsen or fail to improve.    Rutherford Guys, MD Primary Care at [place], [place] [address] Ph.  [phone] Fax [phone]

## 2019-03-18 LAB — WET PREP FOR TRICH, YEAST, CLUE
Clue Cell Exam: NEGATIVE
Trichomonas Exam: NEGATIVE
Yeast Exam: NEGATIVE

## 2019-03-18 LAB — URINE CULTURE: Organism ID, Bacteria: NO GROWTH

## 2019-03-23 NOTE — Progress Notes (Signed)
Left message for pt to call k

## 2019-04-30 ENCOUNTER — Other Ambulatory Visit: Payer: Self-pay

## 2019-04-30 DIAGNOSIS — Z20822 Contact with and (suspected) exposure to covid-19: Secondary | ICD-10-CM

## 2019-05-01 LAB — NOVEL CORONAVIRUS, NAA: SARS-CoV-2, NAA: NOT DETECTED

## 2019-09-27 ENCOUNTER — Encounter (HOSPITAL_COMMUNITY): Payer: Self-pay | Admitting: Emergency Medicine

## 2019-09-27 ENCOUNTER — Emergency Department (HOSPITAL_COMMUNITY)
Admission: EM | Admit: 2019-09-27 | Discharge: 2019-09-27 | Disposition: A | Discharge status: Home / Self Care | Payer: 59 | Attending: Emergency Medicine | Admitting: Emergency Medicine

## 2019-09-27 ENCOUNTER — Other Ambulatory Visit: Payer: Self-pay

## 2019-09-27 DIAGNOSIS — N3000 Acute cystitis without hematuria: Secondary | ICD-10-CM | POA: Diagnosis not present

## 2019-09-27 DIAGNOSIS — R1030 Lower abdominal pain, unspecified: Secondary | ICD-10-CM | POA: Diagnosis not present

## 2019-09-27 DIAGNOSIS — R112 Nausea with vomiting, unspecified: Secondary | ICD-10-CM | POA: Diagnosis not present

## 2019-09-27 DIAGNOSIS — M549 Dorsalgia, unspecified: Secondary | ICD-10-CM | POA: Diagnosis not present

## 2019-09-27 DIAGNOSIS — R519 Headache, unspecified: Secondary | ICD-10-CM | POA: Diagnosis not present

## 2019-09-27 DIAGNOSIS — R300 Dysuria: Secondary | ICD-10-CM | POA: Insufficient documentation

## 2019-09-27 LAB — COMPREHENSIVE METABOLIC PANEL
ALT: 22 U/L (ref 0–44)
AST: 20 U/L (ref 15–41)
Albumin: 4 g/dL (ref 3.5–5.0)
Alkaline Phosphatase: 74 U/L (ref 38–126)
Anion gap: 10 (ref 5–15)
BUN: 16 mg/dL (ref 6–20)
CO2: 25 mmol/L (ref 22–32)
Calcium: 9 mg/dL (ref 8.9–10.3)
Chloride: 105 mmol/L (ref 98–111)
Creatinine, Ser: 0.68 mg/dL (ref 0.44–1.00)
GFR calc Af Amer: >60 mL/min (ref >60)
GFR calc non Af Amer: >60 mL/min (ref >60)
Glucose, Bld: 133 mg/dL — ABNORMAL HIGH (ref 70–99)
Potassium: 4 mmol/L (ref 3.5–5.1)
Sodium: 140 mmol/L (ref 135–145)
Total Bilirubin: 0.8 mg/dL (ref 0.3–1.2)
Total Protein: 6.8 g/dL (ref 6.5–8.1)

## 2019-09-27 LAB — URINALYSIS, ROUTINE W REFLEX MICROSCOPIC
Bacteria, UA: NONE SEEN
Bilirubin Urine: NEGATIVE
Glucose, UA: NEGATIVE mg/dL
Hgb urine dipstick: NEGATIVE
Ketones, ur: NEGATIVE mg/dL
Nitrite: NEGATIVE
Protein, ur: 30 mg/dL — AB
Specific Gravity, Urine: 1.019 (ref 1.005–1.030)
pH: 8 (ref 5.0–8.0)

## 2019-09-27 LAB — CBC
HCT: 47 % — ABNORMAL HIGH (ref 36.0–46.0)
Hemoglobin: 15.1 g/dL — ABNORMAL HIGH (ref 12.0–15.0)
MCH: 29.1 pg (ref 26.0–34.0)
MCHC: 32.1 g/dL (ref 30.0–36.0)
MCV: 90.6 fL (ref 80.0–100.0)
Platelets: 233 10*3/uL (ref 150–400)
RBC: 5.19 MIL/uL — ABNORMAL HIGH (ref 3.87–5.11)
RDW: 12.5 % (ref 11.5–15.5)
WBC: 8.2 10*3/uL (ref 4.0–10.5)
nRBC: 0 % (ref 0.0–0.2)

## 2019-09-27 LAB — LIPASE, BLOOD: Lipase: 27 U/L (ref 11–51)

## 2019-09-27 LAB — I-STAT BETA HCG BLOOD, ED (MC, WL, AP ONLY): I-stat hCG, quantitative: <5 m[IU]/mL (ref <5)

## 2019-09-27 MED ORDER — ONDANSETRON 4 MG PO TBDP
4.0000 mg | ORAL_TABLET | Freq: Once | ORAL | Status: AC
  Administered 2019-09-27: 11:00:00 4 mg via ORAL
  Filled 2019-09-27: qty 1

## 2019-09-27 MED ORDER — CEPHALEXIN 500 MG PO CAPS: 500.0000 mg | ORAL_CAPSULE | Freq: Four times a day (QID) | ORAL | 0 refills | Status: AC

## 2019-09-27 MED ORDER — ONDANSETRON 4 MG PO TBDP: 4.0000 mg | ORAL_TABLET | Freq: Three times a day (TID) | ORAL | 0 refills | Status: DC | PRN

## 2019-09-27 NOTE — Discharge Instructions (Addendum)
Your vomiting could be caused by a virus. I have given you a medication for nausea called zofran. Please try to drink as much fluids as you can to stay well-hydrated. We will treat your urinary symptoms with an antibiotic called keflex. Please take this 4 times per day for 7 days.Please follow up with your regular doctor. If your symptoms worsen or you develop fevers then do not hesitate to come back or be seen by your regular doctor.

## 2019-09-27 NOTE — ED Provider Notes (Signed)
[facility] EMERGENCY DEPARTMENT Provider Note   CSN: [id number] Arrival date & time: 09/27/19  0160     History Chief Complaint  Patient presents with  . Emesis    Rachel Newton is a 54 y.o. female here with one day of NBNB vomiting and nausea with some lower abdominal pain.  PMH significant for HLD.  Patient reports that last night she started experiencing some nausea and vomiting.  States she has not been around any sick contacts.  She denies any fevers, chills, constipation or diarrhea.  Reports that she did not eat anything out of the norm for her.  She states that she also has been having some lower abdominal pain worse around the suprapubic region as well as dysuria.  She denies any increased urinary urgency or frequency however.  She states she is having a little bit of back pain.  She reports she has not had a fever at home.  She has not taken any medication for her symptoms.  She reports that she is also having a slight headache after she threw up 3 times this morning.  Her last episode of vomiting was over 3 hours ago.  She has not tried to eat or drink anything after that episode.  She denies any dizziness or lightheadedness.  Past Medical History:  Diagnosis Date  . Allergy 2001  . Hyperlipidemia   . Migraine 2015   previously treated with propranolol   . Shingles 07/07/2012   Patient Active Problem List   Diagnosis Date Noted  . High cholesterol 04/03/2014  . Pelvic pain in female 03/02/2014  . Bilateral chronic knee pain 03/02/2014  . Bilateral elbow joint pain 03/02/2014    Past Surgical History:  Procedure Laterality Date  . TUBAL LIGATION  1997      OB History    Gravida  3   Para  3   Term  3   Preterm      AB      Living  3     SAB      TAB      Ectopic      Multiple      Live Births              Family History  Problem Relation Age of Onset  . Hypertension Mother   . Cancer Neg Hx   . Heart disease Neg Hx     . Breast cancer Neg Hx     Social History   Tobacco Use  . Smoking status: Never Smoker  . Smokeless tobacco: Never Used  Substance Use Topics  . Alcohol use: No  . Drug use: No    Home Medications Prior to Admission medications   Medication Sig Start Date End Date Taking? Authorizing Provider  ibuprofen (ADVIL) 200 MG tablet Take 600 mg by mouth daily as needed for headache.   Yes [provider]  cephALEXin (KEFLEX) 500 MG capsule Take 1 capsule (500 mg total) by mouth 4 (four) times daily for 7 days. Take for 7 days 09/27/19 10/04/19  Alyx Mcguirk, [place], DO  ondansetron (ZOFRAN-ODT) 4 MG disintegrating tablet Take 1 tablet (4 mg total) by mouth every 8 (eight) hours as needed for nausea or vomiting. 09/27/19   Avy Barlett, [place], DO    Allergies    Meloxicam  Review of Systems   Review of Systems  Constitutional: Negative for chills and fever.  Respiratory: Negative for shortness of breath.   Cardiovascular: Negative for  chest pain and leg swelling.  Gastrointestinal: Positive for abdominal pain, nausea and vomiting. Negative for blood in stool, constipation and diarrhea.  Genitourinary: Positive for dysuria. Negative for flank pain, frequency and vaginal discharge.  Neurological: Positive for headaches. Negative for dizziness.   Physical Exam Updated Vital Signs BP 120/66   Pulse 74   Temp 98.2 F (36.8 C) (Oral)   Resp 16   Ht 5' (1.524 m)   Wt 79.4 kg   SpO2 99%   BMI 34.18 kg/m   Physical Exam Vitals and nursing note reviewed.  Constitutional:      General: She is not in acute distress.    Appearance: Normal appearance. She is normal weight. She is not ill-appearing.  HENT:     Head: Normocephalic and atraumatic.  Eyes:     Extraocular Movements: Extraocular movements intact.     Pupils: Pupils are equal, round, and reactive to light.  Cardiovascular:     Rate and Rhythm: Normal rate and regular rhythm.     Heart sounds: Normal heart sounds.   Pulmonary:     Effort: Pulmonary effort is normal.     Breath sounds: Normal breath sounds.  Abdominal:     General: Bowel sounds are normal. There is no distension.     Palpations: Abdomen is soft.     Tenderness: There is abdominal tenderness in the right lower quadrant, suprapubic area and left lower quadrant. There is no right CVA tenderness, left CVA tenderness, guarding or rebound.  Musculoskeletal:     Cervical back: Normal range of motion and neck supple.  Skin:    General: Skin is warm.     Capillary Refill: Capillary refill takes less than 2 seconds.  Neurological:     General: No focal deficit present.     Mental Status: She is alert. Mental status is at baseline.  Psychiatric:        Mood and Affect: Mood normal.        Behavior: Behavior normal.     ED Results / Procedures / Treatments   Labs (all labs ordered are listed, but only abnormal results are displayed) Labs Reviewed  COMPREHENSIVE METABOLIC PANEL - Abnormal; Notable for the following components:      Result Value   Glucose, Bld 133 (*)    All other components within normal limits  CBC - Abnormal; Notable for the following components:   RBC 5.19 (*)    Hemoglobin 15.1 (*)    HCT 47.0 (*)    All other components within normal limits  URINALYSIS, ROUTINE W REFLEX MICROSCOPIC - Abnormal; Notable for the following components:   Protein, ur 30 (*)    Leukocytes,Ua SMALL (*)    All other components within normal limits  LIPASE, BLOOD  I-STAT BETA HCG BLOOD, ED ([facility], [facility], [facility] ONLY)    EKG None  Radiology No results found.  Procedures Procedures (including critical care time)  Medications Ordered in ED Medications  ondansetron (ZOFRAN-ODT) disintegrating tablet 4 mg (4 mg Oral Given 09/27/19 1042)    ED Course  I have reviewed the triage vital signs and the nursing notes.  Pertinent labs & imaging results that were available during my care of the patient were reviewed by me and considered in my  medical decision making (see chart for details).    MDM Rules/Calculators/A&P                      54 yo female here with  lower abdominal pain and NBNB emesis for one day. Patient afebrile and VSS. She reports last vomiting this am.  Patient well-appearing on exam with some suprapubic and right and left lower quadrant pain on palpation. No rebound tenderness. CMP, CBC wnl. bhCG negative. UA only with small leuks. Patient does not appear dehydrated on exam.   Given hx and exam, likely viral gastroenteritis. Patient may also have UTI given dysuria and suprapubic tenderness. No fever or CVA tenderness concerning for pyelonephritis. Patient having her physical done tomorrow with pelvic exam and refused exam here. Given zofran for nausea and keflex to treat UTI symptoms c/w acute simple cystitis. Return precautions discussed and patient voiced understanding. Encouraged to hydration at home.   Rachel Newton was evaluated in Emergency Department on 09/27/2019 for the symptoms described in the history of present illness. She was evaluated in the context of the global COVID-19 pandemic, which necessitated consideration that the patient might be at risk for infection with the SARS-CoV-2 virus that causes COVID-19. Institutional protocols and algorithms that pertain to the evaluation of patients at risk for COVID-19 are in a state of rapid change based on information released by regulatory bodies including the CDC and federal and state organizations. These policies and algorithms were followed during the patient's care in the ED. Final Clinical Impression(s) / ED Diagnoses Final diagnoses:  Non-intractable vomiting with nausea, unspecified vomiting type  Acute cystitis without hematuria    Rx / DC Orders ED Discharge Orders         Ordered    cephALEXin (KEFLEX) 500 MG capsule  4 times daily     09/27/19 1045    ondansetron (ZOFRAN-ODT) 4 MG disintegrating tablet  Every 8 hours PRN     09/27/19 1045             [place], [place], DO 09/27/19 1124    Melene Plan, DO 09/27/19 1158

## 2019-09-27 NOTE — ED Triage Notes (Signed)
Using translator, pt endorses N/V since last night and unable to sleep. Denies abd pain at this time but intermittent mid abd pain.

## 2019-09-28 ENCOUNTER — Other Ambulatory Visit: Payer: Self-pay | Admitting: Physician Assistant

## 2019-09-28 DIAGNOSIS — Z1231 Encounter for screening mammogram for malignant neoplasm of breast: Secondary | ICD-10-CM

## 2019-10-01 LAB — CYTOLOGY - PAP: Adequacy: ABNORMAL

## 2019-10-07 ENCOUNTER — Other Ambulatory Visit: Payer: Self-pay

## 2019-10-07 ENCOUNTER — Ambulatory Visit
Admission: RE | Admit: 2019-10-07 | Discharge: 2019-10-07 | Disposition: A | Discharge status: Home / Self Care | Payer: 59 | Source: Ambulatory Visit | Attending: Physician Assistant | Admitting: Physician Assistant

## 2019-10-07 DIAGNOSIS — Z1231 Encounter for screening mammogram for malignant neoplasm of breast: Secondary | ICD-10-CM

## 2019-11-17 ENCOUNTER — Other Ambulatory Visit: Payer: Self-pay | Admitting: Physician Assistant

## 2019-11-17 DIAGNOSIS — R42 Dizziness and giddiness: Secondary | ICD-10-CM

## 2019-11-17 DIAGNOSIS — R410 Disorientation, unspecified: Secondary | ICD-10-CM

## 2019-11-17 DIAGNOSIS — R202 Paresthesia of skin: Secondary | ICD-10-CM

## 2019-11-17 DIAGNOSIS — R519 Headache, unspecified: Secondary | ICD-10-CM

## 2019-11-26 ENCOUNTER — Other Ambulatory Visit: Payer: Self-pay

## 2019-11-26 ENCOUNTER — Ambulatory Visit
Admission: RE | Admit: 2019-11-26 | Discharge: 2019-11-26 | Disposition: A | Discharge status: Home / Self Care | Payer: 59 | Source: Ambulatory Visit | Attending: Physician Assistant | Admitting: Physician Assistant

## 2019-11-26 DIAGNOSIS — R42 Dizziness and giddiness: Secondary | ICD-10-CM

## 2019-11-26 DIAGNOSIS — R410 Disorientation, unspecified: Secondary | ICD-10-CM

## 2019-11-26 DIAGNOSIS — R519 Headache, unspecified: Secondary | ICD-10-CM

## 2019-11-26 DIAGNOSIS — R202 Paresthesia of skin: Secondary | ICD-10-CM

## 2019-11-26 MED ORDER — GADOBENATE DIMEGLUMINE 529 MG/ML IV SOLN
17.0000 mL | Freq: Once | INTRAVENOUS | Status: AC | PRN
  Administered 2019-11-26: 17 mL via INTRAVENOUS

## 2020-01-24 ENCOUNTER — Encounter: Payer: Self-pay | Admitting: Family Medicine

## 2020-01-24 ENCOUNTER — Other Ambulatory Visit: Payer: Self-pay

## 2020-01-24 ENCOUNTER — Ambulatory Visit (INDEPENDENT_AMBULATORY_CARE_PROVIDER_SITE_OTHER): Payer: 59 | Admitting: Family Medicine

## 2020-01-24 DIAGNOSIS — M79645 Pain in left finger(s): Secondary | ICD-10-CM

## 2020-01-24 DIAGNOSIS — M79644 Pain in right finger(s): Secondary | ICD-10-CM | POA: Diagnosis not present

## 2020-01-24 DIAGNOSIS — L989 Disorder of the skin and subcutaneous tissue, unspecified: Secondary | ICD-10-CM | POA: Diagnosis not present

## 2020-01-24 DIAGNOSIS — F439 Reaction to severe stress, unspecified: Secondary | ICD-10-CM

## 2020-01-24 MED ORDER — TRAMADOL HCL 50 MG PO TABS: 50.0000 mg | ORAL_TABLET | Freq: Four times a day (QID) | ORAL | 0 refills | Status: DC | PRN

## 2020-01-24 MED ORDER — DICLOFENAC SODIUM 1 % EX GEL: 4.0000 g | Freq: Four times a day (QID) | CUTANEOUS | 6 refills | Status: AC | PRN

## 2020-01-24 NOTE — Progress Notes (Signed)
Patient called after visit and asked for dermatology referral to evaluate skin lesion on face.

## 2020-01-24 NOTE — Addendum Note (Signed)
Addended by: Lillia Carmel on: 01/24/2020 11:31 AM   Modules accepted: Orders

## 2020-01-24 NOTE — Progress Notes (Signed)
° °  Office Visit Note   Patient: Rachel Newton           Date of Birth: Jan 05, 1966           MRN: [id number] Visit Date: 01/24/2020 Requested by: No referring provider defined for this encounter. PCP: Patient, No Pcp Per  Subjective: Chief Complaint  Patient presents with   Left Thumb - Pain   Right Thumb - Pain    HPI: She is here with bilateral thumb pain.  Symptoms started several months ago, she was doing some repetitive activities at that time.  She no longer is working at same job.  Her thumbs get stuck in flexion occasionally.  She has tried Tylenol and an over-the-counter cream with no relief.  No history of diabetes or thyroid dysfunction.  She does note that she has been diagnosed with prediabetes in the past.  She also reports that she is interested in coming here for primary care.  She is struggling with a broken relationship and would like to see a counselor.  Interpreter was present today.               ROS:   All other systems were reviewed and are negative.  Objective: Vital Signs: There were no vitals taken for this visit.  Physical Exam:  General:  Alert and oriented, in no acute distress. Pulm:  Breathing unlabored. Psy:  Normal mood, congruent affect. Skin: No rash or erythema Hands: She has no significant crepitus with passive range of motion of the thumb IP or MCP joints.  She has a tender nodule at the A1 pulley bilaterally and both thumbs trigger in flexion.  Imaging: No results found.  Assessment & Plan: 1.  Bilateral trigger thumbs -We discussed options, she will be going on vacation to [facility] this weekend and would like to have both thumbs injected.  If symptoms persist, when she returns we will refer her to hand therapy.  2.  Situational stress - Psychology referral.     Procedures: Bilateral thumb injections: After sterile prep with Betadine, injected 1 cc 1% lidocaine without epinephrine and 20 mg methylprednisolone into the region  of the A1 pulley bilaterally.    PMFS History: Patient Active Problem List   Diagnosis Date Noted   High cholesterol 04/03/2014   Pelvic pain in female 03/02/2014   Bilateral chronic knee pain 03/02/2014   Bilateral elbow joint pain 03/02/2014   Past Medical History:  Diagnosis Date   Allergy 2001   Hyperlipidemia    Migraine 2015   previously treated with propranolol    Shingles 07/07/2012    Family History  Problem Relation Age of Onset   Hypertension Mother    Cancer Neg Hx    Heart disease Neg Hx    Breast cancer Neg Hx     Past Surgical History:  Procedure Laterality Date   TUBAL LIGATION  1997    Social History   Occupational History   Occupation: Runner, broadcasting/film/video: [employer]  Tobacco Use   Smoking status: Never Smoker   Smokeless tobacco: Never Used  Substance and Sexual Activity   Alcohol use: No   Drug use: No   Sexual activity: Yes    Birth control/protection: None

## 2020-01-26 ENCOUNTER — Encounter: Payer: Self-pay | Admitting: Psychology

## 2020-01-26 ENCOUNTER — Ambulatory Visit: Payer: 59 | Admitting: Family Medicine

## 2020-02-15 ENCOUNTER — Encounter: Payer: 59 | Admitting: Psychology

## 2020-03-07 ENCOUNTER — Ambulatory Visit (INDEPENDENT_AMBULATORY_CARE_PROVIDER_SITE_OTHER): Payer: 59 | Admitting: Family Medicine

## 2020-03-07 ENCOUNTER — Ambulatory Visit (INDEPENDENT_AMBULATORY_CARE_PROVIDER_SITE_OTHER): Payer: 59

## 2020-03-07 ENCOUNTER — Encounter: Payer: 59 | Admitting: Psychology

## 2020-03-07 ENCOUNTER — Encounter: Payer: Self-pay | Admitting: Family Medicine

## 2020-03-07 ENCOUNTER — Other Ambulatory Visit: Payer: Self-pay

## 2020-03-07 DIAGNOSIS — M25552 Pain in left hip: Secondary | ICD-10-CM

## 2020-03-07 MED ORDER — BACLOFEN 10 MG PO TABS: 5.0000 mg | ORAL_TABLET | Freq: Three times a day (TID) | ORAL | 3 refills | Status: AC | PRN

## 2020-03-07 MED ORDER — HYDROCODONE-ACETAMINOPHEN 5-325 MG PO TABS: 1.0000 | ORAL_TABLET | Freq: Four times a day (QID) | ORAL | 0 refills | Status: DC | PRN

## 2020-03-07 NOTE — Progress Notes (Signed)
   Office Visit Note   Patient: Rachel Newton           Date of Birth: 09/26/1965           MRN: [id number] Visit Date: 03/07/2020 Requested by: No referring provider defined for this encounter. PCP: Patient, No Pcp Per  Subjective: Chief Complaint  Patient presents with  . Left Hip - Pain    Pain anterior hip and into the groin for some months - severe since yesterday. NKI. Some pain into the left buttock.    HPI: She is here with left hip pain.  Symptoms started intermittently about a month ago, no injury.  Yesterday the pain became severe.  Pain in the left flank area with radiation toward the left lower quadrant abdomen and anterior hip.  No fevers or chills, no constipation or diarrhea.  No significant urinary symptoms.  No history of kidney stones.  She is not taking anything for the pain.  Interpreter was present today.              ROS:   All other systems were reviewed and are negative.  Objective: Vital Signs: There were no vitals taken for this visit.  Physical Exam:  General:  Alert and oriented, in no acute distress. Pulm:  Breathing unlabored. Psy:  Normal mood, congruent affect. Skin: No rash Left hip: She is tender to palpation in the left costovertebral angle, and has tenderness around the left lower quadrant abdomen.  No rebound or guarding.  There is no pain with passive internal hip rotation.  No pain with hip flexion, abduction or adduction against resistance.  Imaging: XR HIP UNILAT W OR W/O PELVIS 2-3 VIEWS LEFT  Result Date: 03/07/2020 X-rays of the left hip reveal very early degenerative spurring, joint space is still well preserved.  No sign of AVN or stress fracture.   Assessment & Plan: 1.  Left lower quadrant abdomen/hip pain, etiology uncertain.  Cannot rule out nephrolithiasis. -We will check a urinalysis today.  Medicine is called in for pain.  If urinalysis is normal, we will consider physical therapy.     Procedures: No procedures  performed  No notes on file     PMFS History: Patient Active Problem List   Diagnosis Date Noted  . High cholesterol 04/03/2014  . Pelvic pain in female 03/02/2014  . Bilateral chronic knee pain 03/02/2014  . Bilateral elbow joint pain 03/02/2014   Past Medical History:  Diagnosis Date  . Allergy 2001  . Hyperlipidemia   . Migraine 2015   previously treated with propranolol   . Shingles 07/07/2012    Family History  Problem Relation Age of Onset  . Hypertension Mother   . Cancer Neg Hx   . Heart disease Neg Hx   . Breast cancer Neg Hx     Past Surgical History:  Procedure Laterality Date  . TUBAL LIGATION  1997    Social History   Occupational History  . Occupation: Runner, broadcasting/film/video: [employer]  Tobacco Use  . Smoking status: Never Smoker  . Smokeless tobacco: Never Used  Substance and Sexual Activity  . Alcohol use: No  . Drug use: No  . Sexual activity: Yes    Birth control/protection: None

## 2020-03-08 ENCOUNTER — Telehealth: Payer: Self-pay | Admitting: Family Medicine

## 2020-03-08 DIAGNOSIS — M25552 Pain in left hip: Secondary | ICD-10-CM

## 2020-03-08 DIAGNOSIS — R1030 Lower abdominal pain, unspecified: Secondary | ICD-10-CM

## 2020-03-08 LAB — URINALYSIS W MICROSCOPIC + REFLEX CULTURE
Bacteria, UA: NONE SEEN /HPF
Bilirubin Urine: NEGATIVE
Glucose, UA: NEGATIVE
Hgb urine dipstick: NEGATIVE
Hyaline Cast: NONE SEEN /LPF
Ketones, ur: NEGATIVE
Leukocyte Esterase: NEGATIVE
Nitrites, Initial: NEGATIVE
Protein, ur: NEGATIVE
RBC / HPF: NONE SEEN /HPF (ref 0–2)
Specific Gravity, Urine: 1.008 (ref 1.001–1.03)
Squamous Epithelial / HPF: NONE SEEN /HPF (ref <=5)
WBC, UA: NONE SEEN /HPF (ref 0–5)
pH: 6 (ref 5.0–8.0)

## 2020-03-08 LAB — NO CULTURE INDICATED

## 2020-03-08 NOTE — Telephone Encounter (Signed)
I called the patient, using interpreter [phone] Rachel Newton:  advised the patient of the UA results and plan for PT. She said she still feels bloated and having cramps on the left side of her abdomen, with pain below the naval and downward to her private parts. The medication helps a little bit. Her friends have suggested that it may be her ovary or something else gynecological. She has no PCP nor GYN. The patient asks if she could have Rachel Newton as her PCP, as she liked his manner. Please advise on any next steps. The patient asks that we call her daughter, Rachel Newton, tomorrow with the answers, since the patient will be at work from 7a-3:30p. [phone] (speaks [place]).

## 2020-03-08 NOTE — Telephone Encounter (Signed)
Urine looks normal.  Kidney stone is unlikely with normal urinalysis.  I will request referral to physical therapy.

## 2020-03-09 NOTE — Telephone Encounter (Signed)
CT abdomen/pelvis ordered.  I still think PT is a good idea.

## 2020-03-09 NOTE — Addendum Note (Signed)
Addended by: Lillia Carmel on: 03/09/2020 08:01 AM   Modules accepted: Orders

## 2020-03-09 NOTE — Telephone Encounter (Signed)
LMOM for patient of the below message from [facility]

## 2020-03-21 ENCOUNTER — Ambulatory Visit
Admission: RE | Admit: 2020-03-21 | Discharge: 2020-03-21 | Disposition: A | Discharge status: Home / Self Care | Payer: 59 | Source: Ambulatory Visit | Attending: Family Medicine | Admitting: Family Medicine

## 2020-03-21 ENCOUNTER — Other Ambulatory Visit: Payer: Self-pay

## 2020-03-21 ENCOUNTER — Telehealth: Payer: Self-pay | Admitting: Family Medicine

## 2020-03-21 DIAGNOSIS — R1030 Lower abdominal pain, unspecified: Secondary | ICD-10-CM

## 2020-03-21 NOTE — Telephone Encounter (Signed)
CT scan does not show any reason for her cramping.    If still having symptoms, could either refer her to physical therapy for treatment, or could refer to gynecologist for additional examination.

## 2020-03-23 NOTE — Telephone Encounter (Signed)
Left this information on the patient's voice mail - her daughter is able to interpret the results, if needed. The patient has PT starting on Monday the 27th. Advised her to contact us if the cramping does not subside and we can refer her to a gynecologist.

## 2020-03-23 NOTE — Telephone Encounter (Signed)
I also called the patient's daughter, Raquel, and explained all of this to her. She voiced understanding. Advised her to contact us with any further questions/problems.

## 2020-03-26 ENCOUNTER — Ambulatory Visit (INDEPENDENT_AMBULATORY_CARE_PROVIDER_SITE_OTHER): Payer: 59 | Admitting: Rehabilitative and Restorative Service Providers"

## 2020-03-26 ENCOUNTER — Other Ambulatory Visit: Payer: Self-pay

## 2020-03-26 ENCOUNTER — Encounter: Payer: Self-pay | Admitting: Rehabilitative and Restorative Service Providers"

## 2020-03-26 DIAGNOSIS — R262 Difficulty in walking, not elsewhere classified: Secondary | ICD-10-CM | POA: Diagnosis not present

## 2020-03-26 DIAGNOSIS — M6281 Muscle weakness (generalized): Secondary | ICD-10-CM

## 2020-03-26 DIAGNOSIS — M25552 Pain in left hip: Secondary | ICD-10-CM

## 2020-03-26 NOTE — Patient Instructions (Addendum)
Access Code: [email or web address] URL: [email or web address] Date: 03/26/2020 Prepared by: Chyrel Masson  Exercises Clamshell - 2 x daily - 7 x weekly - 3 sets - 10 reps Supine Bridge - 2 x daily - 7 x weekly - 10 reps - 3 sets - 2 hold Small Range Straight Leg Raise - 2 x daily - 7 x weekly - 10 reps - 3 sets Hip Flexor Stretch at Edge of Bed - 2 x daily - 7 x weekly - 1 sets - 5 reps - 15 hold

## 2020-03-26 NOTE — Therapy (Signed)
[facility] [address] [place], [place], [address] Phone: [phone]   Fax:  [phone]  Physical Therapy Evaluation  Patient Details  Name: Rachel Newton MRN: [id number] Date of Birth: 10/17/1965 Referring Provider (PT): Dr. Prince Rome   Encounter Date: 03/26/2020   PT End of Session - 03/26/20 0851    Visit Number 1    Number of Visits 12    Date for PT Re-Evaluation 05/21/20    Progress Note Due on Visit 10    PT Start Time 0853    PT Stop Time 0918    PT Time Calculation (min) 25 min    Activity Tolerance Patient tolerated treatment well    Behavior During Therapy [facility] for tasks assessed/performed           Past Medical History:  Diagnosis Date  . Allergy 2001  . Hyperlipidemia   . Migraine 2015   previously treated with propranolol   . Shingles 07/07/2012    Past Surgical History:  Procedure Laterality Date  . TUBAL LIGATION  1997     There were no vitals filed for this visit.    Subjective Assessment - 03/26/20 0856    Subjective Pt. stated chief complaint of pain in Lt hip lower Lt abdomen on side.  Pt. stated taking medicine has helped symptoms.  Pt. stated pain still present in front primary.    Patient is accompained by: Interpreter    Diagnostic tests Negative xray hip, CT abdomen    Patient Stated Goals Reduce pain    Currently in Pain? Yes    Pain Score 2    pain at worst 8/10   Pain Location Hip    Pain Orientation Left;Anterior;Posterior    Pain Descriptors / Indicators Aching;Sore    Pain Type Acute pain    Pain Onset More than a month ago    Pain Frequency Intermittent    Aggravating Factors  Standing prolonged at work, insidious worsening at times.    Pain Relieving Factors Medicine    Effect of Pain on Daily Activities Pain c work, daily activity              [facility] PT Assessment - 03/26/20 0001      Assessment   Medical Diagnosis Lt hip pain, abdomen pain    Referring Provider (PT) Dr. Prince Rome      Onset Date/Surgical Date 01/29/20    Hand Dominance Right      Precautions   Precautions None      Restrictions   Weight Bearing Restrictions No      Balance Screen   Has the patient fallen in the past 6 months No      Home Environment   Additional Comments Pt. reported no stairs in living environment      Prior Function   Vocation Full time employment    Vocation [employer]ments [employer] (standing prolonged)      Cognition   Overall Cognitive Status Within Functional Limits for tasks assessed      Functional Tests   Functional tests Single leg stance;Sit to Stand      ROM / Strength   AROM / PROM / Strength AROM;PROM;Strength      AROM   Overall AROM Comments Full range Lt hip compared to Rt hip c mild complaints anterior Lt hip noted c hip flexion, IR, extension end ranges    AROM Assessment Site Lumbar;Hip    Right/Left Hip Left;Right      PROM  Overall PROM Comments Similar end range symptoms as AROM on Lt hip    PROM Assessment Site Hip    Right/Left Hip Left;Right      Strength   Strength Assessment Site Hip;Knee;Ankle    Right/Left Hip Left;Right    Right Hip Flexion 5/5    Left Hip Flexion 4+/5    Left Hip Extension 4/5    Left Hip ABduction 4/5    Right/Left Knee Left;Right    Left Knee Flexion 5/5    Left Knee Extension 5/5      Palpation   Palpation comment Tenderness noted in Lt TFL, psoas major, glute med                      Objective measurements completed on examination: See above findings.       [facility] Adult PT Treatment/Exercise - 03/26/20 0001      Exercises   Exercises Other Exercises;Knee/Hip    Other Exercises  HEP instruction/performance c cues for techniques      Knee/Hip Exercises: Stretches   Hip Flexor Stretch Left;Other (comment)   supine 15 sec x 5 thomas test     Knee/Hip Exercises: Supine   Other Supine Knee/Hip Exercises bridge 3 x 10, slr 3 x 10 Lt      Knee/Hip Exercises: Sidelying   Other Sidelying  Knee/Hip Exercises clam shell 3 x 10                  PT Education - 03/26/20 0853    Education Details HEP, POC    Person(s) Educated Patient    Methods Explanation;Demonstration;Verbal cues;Handout    Comprehension Returned demonstration;Verbalized understanding            PT Short Term Goals - 03/26/20 0920      PT SHORT TERM GOAL #1   Title Patient will demonstrate independent use of home exercise program to maintain progress from in clinic treatments.    Time 2    Period Weeks    Status New    Target Date 04/09/20             PT Long Term Goals - 03/26/20 0852      PT LONG TERM GOAL #1   Title Patient will demonstrate/report pain at worst less than or equal to 2/10 to facilitate minimal limitation in daily activity secondary to pain symptoms.    Time 8    Period Weeks    Status New    Target Date 05/21/20      PT LONG TERM GOAL #2   Title Patient will demonstrate independent use of home exercise program to facilitate ability to maintain/progress functional gains from skilled physical therapy services. 8    Time 8    Period Weeks    Status New    Target Date 05/21/20      PT LONG TERM GOAL #3   Title Pt. will demonstrate Lt hip AROM WFL s symptoms to facilitate ability to perform usual mobility at PLOF.    Time 8    Period Weeks    Status New    Target Date 05/21/20      PT LONG TERM GOAL #4   Title Pt. will demonstrate Lt LE MMT 5/5 throughout to facilitate usual work activity at [employer].    Time 8    Period Weeks    Status New    Target Date 05/21/20  Plan - 03/26/20 0852    Clinical Impression Statement Patient is a 54 y.o. female who comes to clinic with complaints of Lt hip pain/abdomen pain with mobility, strength and movement coordination deficits that impair their ability to perform usual daily and recreational functional activities without increase difficulty/symptoms at this time.  Patient to benefit from skilled  PT services to address impairments and limitations to improve to previous level of function without restriction secondary to condition.    Personal Factors and Comorbidities Comorbidity 1    Comorbidities hyperlipidemia    Examination-Activity Limitations Locomotion Level;Stand;Transfers;Bend    Examination-Participation Restrictions Occupation    Stability/Clinical Decision Making Stable/Uncomplicated    Clinical Decision Making Low    Rehab Potential Good    PT Frequency --   1-2x/week   PT Duration 8 weeks    PT Treatment/Interventions ADLs/Self Care Home Management;Cryotherapy;Electrical Stimulation;Iontophoresis 4mg /ml Dexamethasone;Moist Heat;Balance training;Therapeutic exercise;Therapeutic activities;Functional mobility training;Stair training;Gait training;Patient/family education;Neuromuscular re-education;Ultrasound;Spinal Manipulations;Passive range of motion;Joint Manipulations;Dry needling;Taping;Manual techniques    PT Next Visit Plan Reassess HEP, improve core and hip strength    PT Home Exercise Plan    Consulted and Agree with Plan of Care Patient           Patient will benefit from skilled therapeutic intervention in order to improve the following deficits and impairments:  Decreased endurance, Decreased activity tolerance, Decreased strength, Pain, Increased fascial restricitons, Difficulty walking, Decreased mobility, Decreased range of motion, Impaired flexibility, Decreased coordination  Visit Diagnosis: Pain in left hip  Muscle weakness (generalized)  Difficulty in walking, not elsewhere classified     Problem List Patient Active Problem List   Diagnosis Date Noted  . High cholesterol 04/03/2014  . Pelvic pain in female 03/02/2014  . Bilateral chronic knee pain 03/02/2014  . Bilateral elbow joint pain 03/02/2014   05/02/2014, PT, DPT, OCS, ATC 03/26/20  9:23 AM    [facility] Physical Therapy [address] [place], [place], [place] Phone: [phone]   Fax:  [phone]  Name: Rachel Newton MRN: Curtis Sites Date of Birth: Nov 19, 1965

## 2020-04-11 ENCOUNTER — Encounter: Payer: 59 | Admitting: Physical Therapy

## 2020-04-12 ENCOUNTER — Ambulatory Visit: Payer: 59 | Admitting: Orthopedic Surgery

## 2020-04-12 ENCOUNTER — Emergency Department (HOSPITAL_COMMUNITY)
Admission: EM | Admit: 2020-04-12 | Discharge: 2020-04-12 | Disposition: A | Discharge status: Home / Self Care | Payer: 59 | Attending: Emergency Medicine | Admitting: Emergency Medicine

## 2020-04-12 DIAGNOSIS — G43409 Hemiplegic migraine, not intractable, without status migrainosus: Secondary | ICD-10-CM | POA: Insufficient documentation

## 2020-04-12 DIAGNOSIS — R42 Dizziness and giddiness: Secondary | ICD-10-CM

## 2020-04-12 DIAGNOSIS — R202 Paresthesia of skin: Secondary | ICD-10-CM | POA: Insufficient documentation

## 2020-04-12 MED ORDER — MECLIZINE HCL 25 MG PO TABS
25.0000 mg | ORAL_TABLET | Freq: Once | ORAL | Status: AC
  Administered 2020-04-12: 25 mg via ORAL
  Filled 2020-04-12: qty 1

## 2020-04-12 MED ORDER — SODIUM CHLORIDE 0.9 % IV BOLUS
500.0000 mL | Freq: Once | INTRAVENOUS | Status: AC
  Administered 2020-04-12: 500 mL via INTRAVENOUS

## 2020-04-12 MED ORDER — PROCHLORPERAZINE EDISYLATE 10 MG/2ML IJ SOLN
10.0000 mg | Freq: Once | INTRAMUSCULAR | Status: AC
  Administered 2020-04-12: 10 mg via INTRAVENOUS
  Filled 2020-04-12: qty 2

## 2020-04-12 MED ORDER — DIPHENHYDRAMINE HCL 50 MG/ML IJ SOLN
12.5000 mg | Freq: Once | INTRAMUSCULAR | Status: AC
  Administered 2020-04-12: 12.5 mg via INTRAVENOUS
  Filled 2020-04-12: qty 1

## 2020-04-12 MED ORDER — MECLIZINE HCL 25 MG PO TABS: 25.0000 mg | ORAL_TABLET | Freq: Three times a day (TID) | ORAL | 0 refills | Status: AC | PRN

## 2020-04-12 MED ORDER — DEXAMETHASONE SODIUM PHOSPHATE 10 MG/ML IJ SOLN
10.0000 mg | Freq: Once | INTRAMUSCULAR | Status: AC
  Administered 2020-04-12: 10 mg via INTRAVENOUS
  Filled 2020-04-12: qty 1

## 2020-04-12 NOTE — ED Triage Notes (Signed)
Pt arrived via [facility] from home after pt started having dizziness at around 1am this morning. EMS states pt feels very dizzy when she opens her eyes. EMS states pt threw up once tonight but was able to ambulate to stretcher at home. Pt is spanish speaking.

## 2020-04-12 NOTE — ED Provider Notes (Signed)
[facility] EMERGENCY DEPARTMENT Provider Note  CSN: [id number] Arrival date & time: 04/12/20 2683  Chief Complaint(s) Dizziness  HPI Rachel Newton is a 54 y.o. female with a past medical history listed below who presents to the emergency department for sudden onset dizziness described as room spinning.  Worse with eye movement.  Alleviated by closing her eyes.  Patient is unable to ambulate on her own due to her being unsteady.  She also endorses nausea and nonbloody nonbilious emesis due to the dizziness.  Patient also reports having left facial pain, and numbness.  Numbness has subsided.  No recent fevers or infections.  No coughing or congestion.  No focal weakness.  No visual disturbance.  HPI  Past Medical History Past Medical History:  Diagnosis Date  . Allergy 2001  . Hyperlipidemia   . Migraine 2015   previously treated with propranolol   . Shingles 07/07/2012   Patient Active Problem List   Diagnosis Date Noted  . High cholesterol 04/03/2014  . Pelvic pain in female 03/02/2014  . Bilateral chronic knee pain 03/02/2014  . Bilateral elbow joint pain 03/02/2014   Home Medication(s) Prior to Admission medications   Medication Sig Start Date End Date Taking? Authorizing Provider  baclofen (LIORESAL) 10 MG tablet Take 0.5-1 tablets (5-10 mg total) by mouth 3 (three) times daily as needed for muscle spasms. 03/07/20   Hilts, Casimiro Needle, MD  cetirizine (ZYRTEC) 10 MG tablet Take 10 mg by mouth daily. Patient not taking: Reported on 03/07/2020 09/28/19   [provider]  diclofenac Sodium (VOLTAREN) 1 % GEL Apply 4 g topically 4 (four) times daily as needed. Patient not taking: Reported on 03/07/2020 01/24/20   Hilts, Casimiro Needle, MD  HYDROcodone-acetaminophen (NORCO/VICODIN) 5-325 MG tablet Take 1 tablet by mouth every 6 (six) hours as needed for moderate pain. 03/07/20   Hilts, Casimiro Needle, MD  meclizine (ANTIVERT) 25 MG tablet Take 1 tablet (25 mg total) by mouth 3  (three) times daily as needed for dizziness. 04/12/20   Jacqui Headen, Amadeo Garnet, MD  traMADol (ULTRAM) 50 MG tablet Take 1 tablet (50 mg total) by mouth every 6 (six) hours as needed. Patient not taking: Reported on 03/07/2020 01/24/20   Lavada Mesi, MD                                                                                                                                    Past Surgical History Past Surgical History:  Procedure Laterality Date  . TUBAL LIGATION  1997    Family History Family History  Problem Relation Age of Onset  . Hypertension Mother   . Cancer Neg Hx   . Heart disease Neg Hx   . Breast cancer Neg Hx     Social History Social History   Tobacco Use  . Smoking status: Never Smoker  . Smokeless tobacco: Never Used  Substance Use Topics  . Alcohol use:  No  . Drug use: No   Allergies Meloxicam  Review of Systems Review of Systems All other systems are reviewed and are negative for acute change except as noted in the HPI  Physical Exam Vital Signs  I have reviewed the triage vital signs BP 139/74 (BP Location: Right Arm)   Pulse 68   Temp 98.4 F (36.9 C) (Oral)   Resp 18   SpO2 100%   Physical Exam Vitals reviewed.  Constitutional:      General: She is not in acute distress.    Appearance: She is well-developed. She is not diaphoretic.  HENT:     Head: Normocephalic and atraumatic.     Nose: Nose normal.  Eyes:     General: No scleral icterus.       Right eye: No discharge.        Left eye: No discharge.     Conjunctiva/sclera: Conjunctivae normal.     Pupils: Pupils are equal, round, and reactive to light.  Cardiovascular:     Rate and Rhythm: Normal rate and regular rhythm.     Heart sounds: No murmur heard.  No friction rub. No gallop.   Pulmonary:     Effort: Pulmonary effort is normal. No respiratory distress.     Breath sounds: Normal breath sounds. No stridor. No rales.  Abdominal:     General: There is no distension.      Palpations: Abdomen is soft.     Tenderness: There is no abdominal tenderness.  Musculoskeletal:        General: No tenderness.     Cervical back: Normal range of motion and neck supple.  Skin:    General: Skin is warm and dry.     Findings: No erythema or rash.  Neurological:     Mental Status: She is alert and oriented to person, place, and time.     Comments: Mental Status:  Alert and oriented to person, place, and time.  Attention and concentration normal.  Speech clear.  Recent memory is intact  Cranial Nerves:  II Visual Fields: Intact to confrontation. Visual fields intact. III, IV, VI: Pupils equal and reactive to light and near. Full eye movement without nystagmus  V Facial Sensation: Normal. No weakness of masticatory muscles  VII: No facial weakness or asymmetry  VIII Auditory Acuity: Grossly normal  IX/X: The uvula is midline; the palate elevates symmetrically  XI: Normal sternocleidomastoid and trapezius strength  XII: The tongue is midline. No atrophy or fasciculations.   Motor System: Muscle Strength: 5/5 and symmetric in the upper and lower extremities. No pronation or drift.  Muscle Tone: Tone and muscle bulk are normal in the upper and lower extremities.   Reflexes: DTRs: 1+ and symmetrical in all four extremities. No Clonus Coordination: Intact finger-to-nose. No tremor.  Sensation: Intact to light touch  Gait: deferred due to severity of symptoms      ED Results and Treatments Labs (all labs ordered are listed, but only abnormal results are displayed) Labs Reviewed - No data to display  EKG  EKG Interpretation  Date/Time:    Ventricular Rate:    PR Interval:    QRS Duration:   QT Interval:    QTC Calculation:   R Axis:     Text Interpretation:        Radiology No results found.  Pertinent labs & imaging results that were  available during my care of the patient were reviewed by me and considered in my medical decision making (see chart for details).  Medications Ordered in ED Medications  sodium chloride 0.9 % bolus 500 mL (0 mLs Intravenous Stopped 04/12/20 0640)  prochlorperazine (COMPAZINE) injection 10 mg (10 mg Intravenous Given 04/12/20 0404)  diphenhydrAMINE (BENADRYL) injection 12.5 mg (12.5 mg Intravenous Given 04/12/20 0405)  meclizine (ANTIVERT) tablet 25 mg (25 mg Oral Given 04/12/20 0340)  dexamethasone (DECADRON) injection 10 mg (10 mg Intravenous Given 04/12/20 0405)                                                                                                                                    Procedures Procedures  (including critical care time)  Medical Decision Making / ED Course I have reviewed the nursing notes for this encounter and the patient's prior records (if available in EHR or on provided paperwork).   Rachel Newton was evaluated in Emergency Department on 04/12/2020 for the symptoms described in the history of present illness. She was evaluated in the context of the global COVID-19 pandemic, which necessitated consideration that the patient might be at risk for infection with the SARS-CoV-2 virus that causes COVID-19. Institutional protocols and algorithms that pertain to the evaluation of patients at risk for COVID-19 are in a state of rapid change based on information released by regulatory bodies including the CDC and federal and state organizations. These policies and algorithms were followed during the patient's care in the ED.    Clinical Course as of Apr 12 649  Thu Apr 12, 2020  0330 Non focal neuro exam. No recent head trauma. No fever. Doubt meningitis. Doubt intracranial bleed. Doubt IIH. No indication for imaging.   Believe this is more likely migraine related. Considered CVA, but feel is less likely.  Will treat with migraine cocktail and and antivert, then  reevaluate.     [PC]  0440 Following migraine cocktail symptoms resolved.    [PC]  [phone] Able to ambulate w/o complication    [PC]  0650 Unchanged and stable    [PC]    Clinical Course User Index [PC] Severus Brodzinski, Amadeo Garnet, MD     Final Clinical Impression(s) / ED Diagnoses Final diagnoses:  Dizziness  Hemiplegic migraine without status migrainosus, not intractable   The patient appears reasonably screened and/or stabilized for discharge and I doubt any other medical condition or other [facility] requiring further screening, evaluation, or treatment in the ED at this time prior to discharge. Safe for discharge with strict return precautions.  Disposition:  Discharge  Condition: Good  I have discussed the results, Dx and Tx plan with the patient/family who expressed understanding and agree(s) with the plan. Discharge instructions discussed at length. The patient/family was given strict return precautions who verbalized understanding of the instructions. No further questions at time of discharge.    ED Discharge Orders         Ordered    meclizine (ANTIVERT) 25 MG tablet  3 times daily PRN        04/12/20 0650           Follow Up: Primary care provider  Schedule an appointment as soon as possible for a visit        This chart was dictated using voice recognition software.  Despite best efforts to proofread,  errors can occur which can change the documentation meaning.   Nira Connardama, Legrande Hao Eduardo, MD 04/12/20 [phone]

## 2020-04-17 ENCOUNTER — Other Ambulatory Visit: Payer: Self-pay

## 2020-04-17 ENCOUNTER — Encounter: Payer: Self-pay | Admitting: Orthopedic Surgery

## 2020-04-17 ENCOUNTER — Ambulatory Visit (INDEPENDENT_AMBULATORY_CARE_PROVIDER_SITE_OTHER): Payer: 59 | Admitting: Orthopedic Surgery

## 2020-04-17 VITALS — Ht 60.0 in | Wt 175.0 lb

## 2020-04-17 DIAGNOSIS — M79672 Pain in left foot: Secondary | ICD-10-CM | POA: Diagnosis not present

## 2020-04-17 DIAGNOSIS — M79671 Pain in right foot: Secondary | ICD-10-CM

## 2020-04-18 ENCOUNTER — Encounter: Payer: 59 | Admitting: Physical Therapy

## 2020-04-23 ENCOUNTER — Encounter: Payer: Self-pay | Admitting: Orthopedic Surgery

## 2020-04-23 NOTE — Progress Notes (Signed)
Office Visit Note   Patient: Rachel Newton           Date of Birth: 04-26-1966           MRN: [id number] Visit Date: 04/17/2020              Requested by: No referring provider defined for this encounter. PCP: Patient, No Pcp Per  Chief Complaint  Patient presents with  . Right Foot - Pain  . Left Foot - Pain      HPI: Patient is a 54 year old woman who is seen for initial evaluation for painful calluses both feet fifth toe.  Patient states it has been getting worse over the past 2 months patient complains of pain with wearing closed toed shoes.  Assessment & Plan: Visit Diagnoses:  1. Pain in right foot   2. Pain in left foot     Plan: Recommended wider shoes the callus was were pared follow-up if she has recurrent problems.  Follow-Up Instructions: Return if symptoms worsen or fail to improve.   Ortho Exam  Patient is alert, oriented, no adenopathy, well-dressed, normal affect, normal respiratory effort. Examination patient has good pulses bilaterally good ankle good subtalar motion.  She does have callus over the dorsal lateral aspect of the little toe bilaterally there are no plantar warts no hyperkeratotic lesions no signs of infection.  After informed consent the calluses were pared on the right foot without problems.  Imaging: No results found. No images are attached to the encounter.  Labs: Lab Results  Component Value Date   HGBA1C 6.3 (H) 12/10/2017   HGBA1C 5.9 (H) 08/03/2014   ESRSEDRATE 3 03/24/2018   ESRSEDRATE 2 02/08/2009   REPTSTATUS 10/20/2016 FINAL 10/17/2016   CULT >=100,000 COLONIES/mL ESCHERICHIA COLI (A) 10/17/2016   LABORGA ESCHERICHIA COLI (A) 10/17/2016     Lab Results  Component Value Date   ALBUMIN 4.0 09/27/2019   ALBUMIN 4.1 12/10/2017   ALBUMIN 4.6 09/05/2010    No results found for: MG Lab Results  Component Value Date   VD25OH 30 03/02/2014   VD25OH 32 09/05/2010   VD25OH 29 (L) 02/08/2009    No results found  for: PREALBUMIN CBC EXTENDED Latest Ref Rng & Units 09/27/2019 03/24/2018 01/31/2016  WBC 4.0 - 10.5 K/uL 8.2 6.5 5.9  RBC 3.87 - 5.11 MIL/uL 5.19(H) 5.02 4.66  HGB 12.0 - 15.0 g/dL 15.1(H) 14.4 13.3  HCT 36 - 46 % 47.0(H) 43.2 41.5  PLT 150 - 400 K/uL 233 271 229  NEUTROABS 1.7 - 7.7 K/uL - - -  LYMPHSABS 0.7 - 4.0 K/uL - - -     Body mass index is 34.18 kg/m.  Orders:  No orders of the defined types were placed in this encounter.  No orders of the defined types were placed in this encounter.    Procedures: No procedures performed  Clinical Data: No additional findings.  ROS:  All other systems negative, except as noted in the HPI. Review of Systems  Objective: Vital Signs: Ht 5' (1.524 m)   Wt 175 lb (79.4 kg)   BMI 34.18 kg/m   Specialty Comments:  No specialty comments available.  PMFS History: Patient Active Problem List   Diagnosis Date Noted  . High cholesterol 04/03/2014  . Pelvic pain in female 03/02/2014  . Bilateral chronic knee pain 03/02/2014  . Bilateral elbow joint pain 03/02/2014   Past Medical History:  Diagnosis Date  . Allergy 2001  . Hyperlipidemia   .  Migraine 2015   previously treated with propranolol   . Shingles 07/07/2012    Family History  Problem Relation Age of Onset  . Hypertension Mother   . Cancer Neg Hx   . Heart disease Neg Hx   . Breast cancer Neg Hx     Past Surgical History:  Procedure Laterality Date  . TUBAL LIGATION  1997    Social History   Occupational History  . Occupation: Runner, broadcasting/film/video: [employer]  Tobacco Use  . Smoking status: Never Smoker  . Smokeless tobacco: Never Used  Substance and Sexual Activity  . Alcohol use: No  . Drug use: No  . Sexual activity: Yes    Birth control/protection: None

## 2020-04-25 ENCOUNTER — Encounter: Payer: 59 | Admitting: Rehabilitative and Restorative Service Providers"

## 2020-05-23 ENCOUNTER — Ambulatory Visit (INDEPENDENT_AMBULATORY_CARE_PROVIDER_SITE_OTHER): Payer: 59 | Admitting: Family

## 2020-05-23 ENCOUNTER — Encounter: Payer: Self-pay | Admitting: Family

## 2020-05-23 DIAGNOSIS — M65311 Trigger thumb, right thumb: Secondary | ICD-10-CM | POA: Diagnosis not present

## 2020-05-23 DIAGNOSIS — M79644 Pain in right finger(s): Secondary | ICD-10-CM | POA: Diagnosis not present

## 2020-05-23 MED ORDER — METHYLPREDNISOLONE ACETATE 40 MG/ML IJ SUSP
20.0000 mg | INTRAMUSCULAR | Status: AC | PRN
  Administered 2020-05-23: 20 mg

## 2020-05-23 MED ORDER — LIDOCAINE HCL 1 % IJ SOLN
0.5000 mL | INTRAMUSCULAR | Status: AC | PRN
  Administered 2020-05-23: .5 mL

## 2020-05-23 NOTE — Progress Notes (Signed)
Office Visit Note   Patient: Rachel Newton           Date of Birth: Nov 21, 1965           MRN: [id number] Visit Date: 05/23/2020              Requested by: No referring provider defined for this encounter. PCP: Patient, No Pcp Per  No chief complaint on file.     HPI: The patient is a 54 year old woman who presents today complaining of right thumb pain.  She has had locking and catching of the thumb she has a history of A1 pulley issues trigger finger of bilateral thumbs.  She first had these injected in July of this year with Dr. Prince Rome.  She states the injection worked quite well for the left unfortunately on the right she only had about 1 week of relief this has been fairly constant since the injection earlier this summer.  She complains of some mild swelling pain with locking would like a repeat injection today  Assessment & Plan: Visit Diagnoses:  1. Trigger finger of right thumb   2. Pain of right thumb     Plan: Right thumb trigger finger injection today Depo-Medrol she tolerated this well discussed A1 release is an option if cannot get relief with injections.  She will call or return if she fails to improve  Follow-Up Instructions: Return if symptoms worsen or fail to improve.   Right Hand Exam   Tenderness  The patient is experiencing tenderness in the palmar area.  Comments:  Triggering with flexion on exam, tenderness at area of a1 pulley with palpable nodule      Patient is alert, oriented, no adenopathy, well-dressed, normal affect, normal respiratory effort.   Imaging: No results found. No images are attached to the encounter.  Labs: Lab Results  Component Value Date   HGBA1C 6.3 (H) 12/10/2017   HGBA1C 5.9 (H) 08/03/2014   ESRSEDRATE 3 03/24/2018   ESRSEDRATE 2 02/08/2009   REPTSTATUS 10/20/2016 FINAL 10/17/2016   CULT >=100,000 COLONIES/mL ESCHERICHIA COLI (A) 10/17/2016   LABORGA ESCHERICHIA COLI (A) 10/17/2016     Lab Results  Component  Value Date   ALBUMIN 4.0 09/27/2019   ALBUMIN 4.1 12/10/2017   ALBUMIN 4.6 09/05/2010    No results found for: MG Lab Results  Component Value Date   VD25OH 30 03/02/2014   VD25OH 32 09/05/2010   VD25OH 29 (L) 02/08/2009    No results found for: PREALBUMIN CBC EXTENDED Latest Ref Rng & Units 09/27/2019 03/24/2018 01/31/2016  WBC 4.0 - 10.5 K/uL 8.2 6.5 5.9  RBC 3.87 - 5.11 MIL/uL 5.19(H) 5.02 4.66  HGB 12.0 - 15.0 g/dL 15.1(H) 14.4 13.3  HCT 36 - 46 % 47.0(H) 43.2 41.5  PLT 150 - 400 K/uL 233 271 229  NEUTROABS 1.7 - 7.7 K/uL - - -  LYMPHSABS 0.7 - 4.0 K/uL - - -     There is no height or weight on file to calculate BMI.  Orders:  Orders Placed This Encounter  Procedures  . Hand/UE Inj   No orders of the defined types were placed in this encounter.    Procedures: Hand/UE Inj: R thumb A1 for trigger finger on 05/23/2020 3:40 PM Indications: diagnostic and therapeutic Details: 22 G needle Medications: 0.5 mL lidocaine 1 %; 20 mg methylPREDNISolone acetate 40 MG/ML Outcome: tolerated well, no immediate complications Procedure, treatment alternatives, risks and benefits explained, specific risks discussed. Consent was given by the  patient. Immediately prior to procedure a time out was called to verify the correct patient, procedure, equipment, support staff and site/side marked as required. Patient was prepped and draped in the usual sterile fashion.      Clinical Data: No additional findings.  ROS:  All other systems negative, except as noted in the HPI. Review of Systems  Constitutional: Negative for chills and fever.  Musculoskeletal: Positive for arthralgias and myalgias. Negative for joint swelling.    Objective: Vital Signs: There were no vitals taken for this visit.  Specialty Comments:  No specialty comments available.  PMFS History: Patient Active Problem List   Diagnosis Date Noted  . High cholesterol 04/03/2014  . Pelvic pain in female 03/02/2014   . Bilateral chronic knee pain 03/02/2014  . Bilateral elbow joint pain 03/02/2014   Past Medical History:  Diagnosis Date  . Allergy 2001  . Hyperlipidemia   . Migraine 2015   previously treated with propranolol   . Shingles 07/07/2012    Family History  Problem Relation Age of Onset  . Hypertension Mother   . Cancer Neg Hx   . Heart disease Neg Hx   . Breast cancer Neg Hx     Past Surgical History:  Procedure Laterality Date  . TUBAL LIGATION  1997    Social History   Occupational History  . Occupation: Runner, broadcasting/film/video: [employer]  Tobacco Use  . Smoking status: Never Smoker  . Smokeless tobacco: Never Used  Substance and Sexual Activity  . Alcohol use: No  . Drug use: No  . Sexual activity: Yes    Birth control/protection: None

## 2020-06-25 ENCOUNTER — Ambulatory Visit: Payer: 59 | Admitting: Podiatry

## 2020-06-25 ENCOUNTER — Other Ambulatory Visit: Payer: Self-pay

## 2020-06-25 DIAGNOSIS — Z01818 Encounter for other preprocedural examination: Secondary | ICD-10-CM | POA: Diagnosis not present

## 2020-06-25 DIAGNOSIS — M2042 Other hammer toe(s) (acquired), left foot: Secondary | ICD-10-CM

## 2020-06-25 DIAGNOSIS — M2041 Other hammer toe(s) (acquired), right foot: Secondary | ICD-10-CM | POA: Diagnosis not present

## 2020-06-26 ENCOUNTER — Encounter: Payer: Self-pay | Admitting: Podiatry

## 2020-06-26 NOTE — Progress Notes (Signed)
Subjective:  Patient ID: Rachel Newton, female    DOB: April 09, 1966,  MRN: [id number]  Chief Complaint  Patient presents with  . Callouses    B/L- 5th digit- 2-3 years     53 y.o. female presents with the above complaint.  Patient presents with complaint bilateral fifth digit hammertoe contracture with hyperkeratotic lesion.  Patient states is very painful to touch.  She has tried padding protecting of the shoe gear modification none of which has helped.  She is seeing other podiatrist that have shaved down but has not helped at all.  She would like to discuss surgical interventions for this.  Is been going for 2 to 3 years has progressive gotten worse.  She denies any other acute complaints.   Review of Systems: Negative except as noted in the HPI. Denies N/V/F/Ch.  Past Medical History:  Diagnosis Date  . Allergy 2001  . Hyperlipidemia   . Migraine 2015   previously treated with propranolol   . Shingles 07/07/2012    Current Outpatient Medications:  .  baclofen (LIORESAL) 10 MG tablet, Take 0.5-1 tablets (5-10 mg total) by mouth 3 (three) times daily as needed for muscle spasms., Disp: 30 each, Rfl: 3 .  cetirizine (ZYRTEC) 10 MG tablet, Take 10 mg by mouth daily. , Disp: , Rfl:  .  diclofenac Sodium (VOLTAREN) 1 % GEL, Apply 4 g topically 4 (four) times daily as needed., Disp: 500 g, Rfl: 6 .  HYDROcodone-acetaminophen (NORCO/VICODIN) 5-325 MG tablet, Take 1 tablet by mouth every 6 (six) hours as needed for moderate pain., Disp: 20 tablet, Rfl: 0 .  meclizine (ANTIVERT) 25 MG tablet, Take 1 tablet (25 mg total) by mouth 3 (three) times daily as needed for dizziness., Disp: 30 tablet, Rfl: 0 .  traMADol (ULTRAM) 50 MG tablet, Take 1 tablet (50 mg total) by mouth every 6 (six) hours as needed., Disp: 20 tablet, Rfl: 0  Social History   Tobacco Use  Smoking Status Never Smoker  Smokeless Tobacco Never Used    Allergies  Allergen Reactions  . Meloxicam Hives   Objective:   There were no vitals filed for this visit. There is no height or weight on file to calculate BMI. Constitutional Well developed. Well nourished.  Vascular Dorsalis pedis pulses palpable bilaterally. Posterior tibial pulses palpable bilaterally. Capillary refill normal to all digits.  No cyanosis or clubbing noted. Pedal hair growth normal.  Neurologic Normal speech. Oriented to person, place, and time. Epicritic sensation to light touch grossly present bilaterally.  Dermatologic Nails well groomed and normal in appearance. No open wounds. No skin lesions.  Orthopedic:  Pain on palpation to the hyperkeratotic lesion of the fifth digit bilaterally.  Semiflexible hammertoe contracture noted to bilateral fifth digit with adductovarus rotation   Radiographs: 3 views of skeletally mature adult foot bilateral: Bilateral fifth digit hammertoe contracture noted with an adductovarus deformity.  No other bony abnormalities identified Assessment:   1. Hammertoe of right foot   2. Hammertoe of left foot   3. Preoperative examination    Plan:  Patient was evaluated and treated and all questions answered.  Bilateral fifth digit hammertoe semiflexible -I explained to patient the etiology of hammertoe contractures and various treatment options were extensively discussed.  Given the amount of pain that she is having without any resolve meant with conservative treatment options including shoe gear modification and protecting I believe patient will benefit from a surgical intervention.  I discussed with the patient my surgical plan  as well as my postop protocol in extensive detail.  I believe she will benefit from fifth digit arthroplasty with addressing the derotational component.  Patient agrees with the plan would like to proceed with the surgery.  She will be weightbearing as tolerated bilateral surgical shoe after the surgery. -Informed surgical risk consent was reviewed and read aloud to the  patient.  I reviewed the films.  I have discussed my findings with the patient in great detail.  I have discussed all risks including but not limited to infection, stiffness, scarring, limp, disability, deformity, damage to blood vessels and nerves, numbness, poor healing, need for braces, arthritis, chronic pain, amputation, death.  All benefits and realistic expectations discussed in great detail.  I have made no promises as to the outcome.  I have provided realistic expectations.  I have offered the patient a 2nd opinion, which they have declined and assured me they preferred to proceed despite the risks -A total of 46 minutes was spent in direct patient care as well as pre and post patient encounter activities.  This includes documentation as well as reviewing patient chart for labs, imaging, past medical, surgical, social, and family history as documented in the EMR.  I have reviewed medication allergies as documented in EMR.  I discussed the etiology of condition and treatment options from conservative to surgical care.  All risks and benefit of the treatment course was discussed in detail.  All questions were answered and return appointment was discussed.  Since the visit completed in an ambulatory/outpatient setting, the patient and/or parent/guardian has been advised to contact the providers office for worsening condition and seek medical treatment and/or call 911 if the patient deems either is necessary.   No follow-ups on file.

## 2020-07-09 ENCOUNTER — Telehealth: Payer: Self-pay

## 2020-07-09 NOTE — Telephone Encounter (Signed)
DOS 07/18/2020 - OFFICE SURGERY  HAMMERTOE REPAIR 5TH B/L - [id number]  RECEIVED [employer] FROM [facility] - AUTH # [id number] & [id number] GOOD FROM 07/18/2020 - 10/16/2020

## 2020-07-18 ENCOUNTER — Ambulatory Visit (INDEPENDENT_AMBULATORY_CARE_PROVIDER_SITE_OTHER): Payer: 59 | Admitting: Podiatry

## 2020-07-18 ENCOUNTER — Encounter: Payer: Self-pay | Admitting: Podiatry

## 2020-07-18 ENCOUNTER — Other Ambulatory Visit: Payer: Self-pay

## 2020-07-18 DIAGNOSIS — M2041 Other hammer toe(s) (acquired), right foot: Secondary | ICD-10-CM | POA: Diagnosis not present

## 2020-07-18 DIAGNOSIS — M2042 Other hammer toe(s) (acquired), left foot: Secondary | ICD-10-CM | POA: Diagnosis not present

## 2020-07-18 MED ORDER — OXYCODONE-ACETAMINOPHEN 5-325 MG PO TABS: 1.0000 | ORAL_TABLET | ORAL | 0 refills | Status: DC | PRN

## 2020-07-18 NOTE — Progress Notes (Signed)
Surgeon: Dr. Boneta Lucks, D.P.M. Assistants: None Pre-operative diagnosis: Bilateral fifth digit hammertoe contracture Post-operative diagnosis: same Procedure: Bilateral fifth digit PIPJ arthroplasty derotational Pathology: None Pertinent Intra-op findings: Hammertoe contracture noted at the IPJ with rotation of the digit Anesthesia: MAC with 3 cc of one-to-one mixture of half percent Marcaine plain 1% lidocaine plain to each digit Hemostasis: Bilateral ankle tourniquet for 20 minutesEBL:   Materials: 3-0 nylon, 3-0 Vicryl Injectables: None Complications: None  Indications for surgery: A 55 y.o. female presents with bilateral fifth digit hammertoe contracture painful. Patient has failed all conservative therapy including but not limited to padding, shoe gear modification. She wishes to have surgical correction of the foot/deformity. It was determined that patient would benefit from bilateral fifth digit hammer toe derotational arthroplasty of the interphalangeal joint proximal. Informed surgical risk consent was reviewed and read aloud to the patient.  I reviewed the films.  I have discussed my findings with the patient in great detail.  I have discussed all risks including but not limited to infection, stiffness, scarring, limp, disability, deformity, damage to blood vessels and nerves, numbness, poor healing, need for braces, arthritis, chronic pain, amputation, death.  All benefits and realistic expectations discussed in great detail.  I have made no promises as to the outcome.  I have provided realistic expectations.  I have offered the patient a 2nd opinion, which they have declined and assured me they preferred to proceed despite the risks   Procedure in detail: The patient was both verbally and visually identified by myself, the nursing staff, and anesthesia staff in the preoperative holding area. They were then transferred to the operating room and placed on the operative table in supine  position.  Tourniquet was inflated  Attention was directed to bilateral fifth digit using a skin marker elliptical incision was utilized from distal medial to proximal lateral.  Using a #15 blade the incision was carried down to through the epidermis dermal layer down to the subcutaneous tissue.  At this time extensor digitorum longus tendon was visualized a transverse tenotomy was performed to expose the PIPJ joint.  Using a sagittal saw the head of the proximal phalanx was removed to allow for the derotation of the digit.  Good correction alignment noted.  The wound was irrigated with normal saline solution.  The tendon was repaired with 3-0 Vicryl in simple interrupted suture technique.  The skin was primarily closed with simple interrupted suture technique with 3-0 nylon.  Tourniquet was deflated.  Good cap refill noted to both lower extremity.  At the conclusion of the procedure the patient was awoken from anesthesia and found to have tolerated the procedure well any complications. There were transferred to PACU with vital signs stable and vascular status intact.  Boneta Lucks, DPM

## 2020-07-25 ENCOUNTER — Other Ambulatory Visit: Payer: Self-pay

## 2020-07-25 ENCOUNTER — Ambulatory Visit (INDEPENDENT_AMBULATORY_CARE_PROVIDER_SITE_OTHER): Payer: 59 | Admitting: Podiatry

## 2020-07-25 ENCOUNTER — Ambulatory Visit (INDEPENDENT_AMBULATORY_CARE_PROVIDER_SITE_OTHER): Payer: 59

## 2020-07-25 ENCOUNTER — Encounter: Payer: 59 | Admitting: Podiatry

## 2020-07-25 DIAGNOSIS — M2041 Other hammer toe(s) (acquired), right foot: Secondary | ICD-10-CM | POA: Diagnosis not present

## 2020-07-25 DIAGNOSIS — M2042 Other hammer toe(s) (acquired), left foot: Secondary | ICD-10-CM | POA: Diagnosis not present

## 2020-07-25 DIAGNOSIS — Z9889 Other specified postprocedural states: Secondary | ICD-10-CM

## 2020-07-26 ENCOUNTER — Encounter: Payer: Self-pay | Admitting: Podiatry

## 2020-07-26 NOTE — Progress Notes (Signed)
  Subjective:  Patient ID: Rachel Newton, female    DOB: 1966-03-09,  MRN: [id number]  Chief Complaint  Patient presents with  . Routine Post Op    Post op hammer toe     DOS: 07/18/2020 Procedure: Bilateral fifth digit hammertoe  55 y.o. female returns for post-op check.  Patient is doing well.  She has mild numbness and tingling.  No acute complaints she has been taking her pain medication.  She is ambulating with surgical shoe.  Review of Systems: Negative except as noted in the HPI. Denies N/V/F/Ch.  Past Medical History:  Diagnosis Date  . Allergy 2001  . Hyperlipidemia   . Migraine 2015   previously treated with propranolol   . Shingles 07/07/2012    Current Outpatient Medications:  .  baclofen (LIORESAL) 10 MG tablet, Take 0.5-1 tablets (5-10 mg total) by mouth 3 (three) times daily as needed for muscle spasms., Disp: 30 each, Rfl: 3 .  cetirizine (ZYRTEC) 10 MG tablet, Take 10 mg by mouth daily. , Disp: , Rfl:  .  diclofenac Sodium (VOLTAREN) 1 % GEL, Apply 4 g topically 4 (four) times daily as needed., Disp: 500 g, Rfl: 6 .  HYDROcodone-acetaminophen (NORCO/VICODIN) 5-325 MG tablet, Take 1 tablet by mouth every 6 (six) hours as needed for moderate pain., Disp: 20 tablet, Rfl: 0 .  meclizine (ANTIVERT) 25 MG tablet, Take 1 tablet (25 mg total) by mouth 3 (three) times daily as needed for dizziness., Disp: 30 tablet, Rfl: 0 .  oxyCODONE-acetaminophen (PERCOCET) 5-325 MG tablet, Take 1-2 tablets by mouth every 4 (four) hours as needed for severe pain., Disp: 30 tablet, Rfl: 0 .  traMADol (ULTRAM) 50 MG tablet, Take 1 tablet (50 mg total) by mouth every 6 (six) hours as needed., Disp: 20 tablet, Rfl: 0  Social History   Tobacco Use  Smoking Status Never Smoker  Smokeless Tobacco Never Used    Allergies  Allergen Reactions  . Meloxicam Hives   Objective:  There were no vitals filed for this visit. There is no height or weight on file to calculate BMI. Constitutional  Well developed. Well nourished.  Vascular Foot warm and well perfused. Capillary refill normal to all digits.   Neurologic Normal speech. Oriented to person, place, and time. Epicritic sensation to light touch grossly present bilaterally.  Dermatologic Skin healing well without signs of infection. Skin edges well coapted without signs of infection.  Orthopedic: Tenderness to palpation noted about the surgical site.   Radiographs: 3 views of skeletally mature adult bilateral foot: Good correction alignment noted.  Arthroplasty of the fifth digit noted bilaterally. Assessment:   1. Hammer toes of both feet   2. Status post foot surgery    Plan:  Patient was evaluated and treated and all questions answered.  S/p foot surgery bilaterally -Progressing as expected post-operatively. -XR: See above -WB Status: Weightbearing as tolerated in surgical shoe -Sutures: Intact.  No signs of dehiscence.  No clinical signs of infection noted. -Medications: None -Foot redressed.  No follow-ups on file.

## 2020-08-01 ENCOUNTER — Encounter: Payer: Self-pay | Admitting: Podiatry

## 2020-08-01 ENCOUNTER — Other Ambulatory Visit: Payer: Self-pay

## 2020-08-01 ENCOUNTER — Ambulatory Visit (INDEPENDENT_AMBULATORY_CARE_PROVIDER_SITE_OTHER): Payer: 59 | Admitting: Podiatry

## 2020-08-01 DIAGNOSIS — M2041 Other hammer toe(s) (acquired), right foot: Secondary | ICD-10-CM

## 2020-08-01 DIAGNOSIS — Z9889 Other specified postprocedural states: Secondary | ICD-10-CM

## 2020-08-01 DIAGNOSIS — M2042 Other hammer toe(s) (acquired), left foot: Secondary | ICD-10-CM

## 2020-08-07 ENCOUNTER — Encounter: Payer: Self-pay | Admitting: Podiatry

## 2020-08-07 NOTE — Progress Notes (Signed)
  Subjective:  Patient ID: Rachel Newton, female    DOB: 30-Jan-1966,  MRN: [id number]  Chief Complaint  Patient presents with  . Routine Post Op    POV # 2 DOS 07/18/2020 B/L 5TH HAMMERTOE REPAIR   "The left one is just a little sore"    DOS: 07/18/2020 Procedure: Bilateral fifth digit hammertoe  55 y.o. female returns for post-op check.  Patient is doing well.  She denies any other acute complaints.  She states that overall she is doing better.  She is happy with the correction.  Review of Systems: Negative except as noted in the HPI. Denies N/V/F/Ch.  Past Medical History:  Diagnosis Date  . Allergy 2001  . Hyperlipidemia   . Migraine 2015   previously treated with propranolol   . Shingles 07/07/2012    Current Outpatient Medications:  .  baclofen (LIORESAL) 10 MG tablet, Take 0.5-1 tablets (5-10 mg total) by mouth 3 (three) times daily as needed for muscle spasms., Disp: 30 each, Rfl: 3 .  cetirizine (ZYRTEC) 10 MG tablet, Take 10 mg by mouth daily. , Disp: , Rfl:  .  diclofenac Sodium (VOLTAREN) 1 % GEL, Apply 4 g topically 4 (four) times daily as needed., Disp: 500 g, Rfl: 6 .  HYDROcodone-acetaminophen (NORCO/VICODIN) 5-325 MG tablet, Take 1 tablet by mouth every 6 (six) hours as needed for moderate pain., Disp: 20 tablet, Rfl: 0 .  meclizine (ANTIVERT) 25 MG tablet, Take 1 tablet (25 mg total) by mouth 3 (three) times daily as needed for dizziness., Disp: 30 tablet, Rfl: 0 .  oxyCODONE-acetaminophen (PERCOCET) 5-325 MG tablet, Take 1-2 tablets by mouth every 4 (four) hours as needed for severe pain., Disp: 30 tablet, Rfl: 0 .  traMADol (ULTRAM) 50 MG tablet, Take 1 tablet (50 mg total) by mouth every 6 (six) hours as needed., Disp: 20 tablet, Rfl: 0  Social History   Tobacco Use  Smoking Status Never Smoker  Smokeless Tobacco Never Used    Allergies  Allergen Reactions  . Meloxicam Hives   Objective:  There were no vitals filed for this visit. There is no height or  weight on file to calculate BMI. Constitutional Well developed. Well nourished.  Vascular Foot warm and well perfused. Capillary refill normal to all digits.   Neurologic Normal speech. Oriented to person, place, and time. Epicritic sensation to light touch grossly present bilaterally.  Dermatologic  skin completely reepithelialized.  Good correction alignment noted.  No further pain noted bilaterally.  Orthopedic:  Now tenderness to palpation noted about the surgical site.   Radiographs: 3 views of skeletally mature adult bilateral foot: Good correction alignment noted.  Arthroplasty of the fifth digit noted bilaterally. Assessment:   1. Hammer toes of both feet   2. Status post foot surgery    Plan:  Patient was evaluated and treated and all questions answered.  S/p foot surgery bilaterally -Progressing as expected post-operatively. -XR: See above -WB Status: Weightbearing as tolerated in regular shoes -Sutures: None.  No dehiscence noted no infection noted -Medications: None -Patient can transition to regular shoes without any reservation.  I discussed with the patient that she is officially discharged from my care if any foot and ankle issues arise in the future come back and see me.  She states understanding  No follow-ups on file.

## 2020-08-08 ENCOUNTER — Encounter: Payer: 59 | Admitting: Podiatry

## 2020-08-17 ENCOUNTER — Ambulatory Visit (INDEPENDENT_AMBULATORY_CARE_PROVIDER_SITE_OTHER): Payer: 59 | Admitting: Podiatry

## 2020-08-17 ENCOUNTER — Other Ambulatory Visit: Payer: Self-pay

## 2020-08-17 ENCOUNTER — Ambulatory Visit (INDEPENDENT_AMBULATORY_CARE_PROVIDER_SITE_OTHER): Payer: 59

## 2020-08-17 DIAGNOSIS — Z9889 Other specified postprocedural states: Secondary | ICD-10-CM

## 2020-08-17 DIAGNOSIS — M792 Neuralgia and neuritis, unspecified: Secondary | ICD-10-CM

## 2020-08-17 DIAGNOSIS — M2041 Other hammer toe(s) (acquired), right foot: Secondary | ICD-10-CM

## 2020-08-17 DIAGNOSIS — M2042 Other hammer toe(s) (acquired), left foot: Secondary | ICD-10-CM | POA: Diagnosis not present

## 2020-08-26 ENCOUNTER — Encounter: Payer: Self-pay | Admitting: Podiatry

## 2020-08-26 NOTE — Progress Notes (Signed)
Subjective:  Patient ID: Rachel Newton, female    DOB: [birth year],  MRN: [id number]  Chief Complaint  Patient presents with  . Post-op Problem    PT stated that she is having pain and swelling     DOS: 07/18/2020 Procedure: Bilateral fifth digit hammertoe  55 y.o. female returns for post-op check and new complaint of neuritis especially when ambulating and returning to work.  Overall she does not have any problem with the surgery.  However she states that there is pain when she is wearing shoes and is ambulating.  She has not made any shoe gear modification.  She would like to discuss treatment options for it.  Review of Systems: Negative except as noted in the HPI. Denies N/V/F/Ch.  Past Medical History:  Diagnosis Date  . Allergy 2001  . Hyperlipidemia   . Migraine 2015   previously treated with propranolol   . Shingles 07/07/2012    Current Outpatient Medications:  .  baclofen (LIORESAL) 10 MG tablet, Take 0.5-1 tablets (5-10 mg total) by mouth 3 (three) times daily as needed for muscle spasms., Disp: 30 each, Rfl: 3 .  cetirizine (ZYRTEC) 10 MG tablet, Take 10 mg by mouth daily. , Disp: , Rfl:  .  diclofenac Sodium (VOLTAREN) 1 % GEL, Apply 4 g topically 4 (four) times daily as needed., Disp: 500 g, Rfl: 6 .  HYDROcodone-acetaminophen (NORCO/VICODIN) 5-325 MG tablet, Take 1 tablet by mouth every 6 (six) hours as needed for moderate pain., Disp: 20 tablet, Rfl: 0 .  meclizine (ANTIVERT) 25 MG tablet, Take 1 tablet (25 mg total) by mouth 3 (three) times daily as needed for dizziness., Disp: 30 tablet, Rfl: 0 .  oxyCODONE-acetaminophen (PERCOCET) 5-325 MG tablet, Take 1-2 tablets by mouth every 4 (four) hours as needed for severe pain., Disp: 30 tablet, Rfl: 0 .  traMADol (ULTRAM) 50 MG tablet, Take 1 tablet (50 mg total) by mouth every 6 (six) hours as needed., Disp: 20 tablet, Rfl: 0  Social History   Tobacco Use  Smoking Status Never Smoker  Smokeless Tobacco Never Used     Allergies  Allergen Reactions  . Meloxicam Hives   Objective:  There were no vitals filed for this visit. There is no height or weight on file to calculate BMI. Constitutional Well developed. Well nourished.  Vascular Foot warm and well perfused. Capillary refill normal to all digits.   Neurologic Normal speech. Oriented to person, place, and time. Epicritic sensation to light touch grossly present bilaterally.  Dermatologic  skin completely reepithelialized.  Good correction alignment noted.  No further pain noted bilaterally.  Pain on palpation/recurrent of hyperkeratotic lesion secondary to tight external shoes.  Surgical site has healed completely.  Orthopedic:  No  tenderness to palpation noted about the surgical site.   Radiographs: 3 views of skeletally mature adult bilateral foot: Good correction alignment noted.  Arthroplasty of the fifth digit noted bilaterally.  No new problems noted. Assessment:   1. Neuritis    Plan:  Patient was evaluated and treated and all questions answered.  S/p foot surgery bilaterally -Clinically she is doing well.  No complaints from surgical site except for the neuritis/numbness tingling.  No new evidence of radiographic recurrence.  Neuritis to bilateral fifth digit -I discussed with her the etiology of neuritis and various treatment options were discussed.  It seems like patient shoes are rubbing against both of the fifth digit as she tends to be wearing very narrow shoes.  Patient has  had recurrence of hyperkeratotic lesion as a sign that she has excessive external pressure.  I discussed with her the importance of shoe gear modification to help address that.  I gave her shoe options as well.  She states understanding and would like to proceed with shoe gear modification.  No follow-ups on file.

## 2020-08-29 ENCOUNTER — Encounter: Payer: Self-pay | Admitting: Family Medicine

## 2020-08-29 ENCOUNTER — Ambulatory Visit (INDEPENDENT_AMBULATORY_CARE_PROVIDER_SITE_OTHER): Payer: 59 | Admitting: Family Medicine

## 2020-08-29 ENCOUNTER — Other Ambulatory Visit: Payer: Self-pay

## 2020-08-29 DIAGNOSIS — M79644 Pain in right finger(s): Secondary | ICD-10-CM

## 2020-08-29 NOTE — Progress Notes (Signed)
   Office Visit Note   Patient: Rachel Newton           Date of Birth: 08/29/1965           MRN: [id number] Visit Date: 08/29/2020 Requested by: No referring provider defined for this encounter. PCP: Patient, No Pcp Per  Subjective: Chief Complaint  Patient presents with  . Right Hand - Pain    Painful at [facility] joint, she has an injection, states that the injection lasted 1-2 months, was told that she may need surgery.  She states that both thumbs were injected but the left is doing fine. 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     HPI: She is here with persistent right thumb pain.  I injected her trigger thumb last summer and she had another injection by Denny Peon in November.  Neither 1 made her thumb stopped triggering.  Last summer her left thumb was also injected and it feels fine.  She is very frustrated by her ongoing symptoms, when it gets stuck in flexion it wakes her up at night.  She is here with an interpreter today.                ROS:   All other systems were reviewed and are negative.  Objective: Vital Signs: There were no vitals taken for this visit.  Physical Exam:  General:  Alert and oriented, in no acute distress. Pulm:  Breathing unlabored. Psy:  Normal mood, congruent affect.  Right thumb: She has triggering at the A1 pulley and a tender nodule.   Imaging: No results found.  Assessment & Plan: 1.  Chronic right trigger thumb -We discussed various options and she would like to consult with Dr. Roda Shutters to discuss surgical release.  I will see her back as needed.     Procedures: No procedures performed        PMFS History: Patient Active Problem List   Diagnosis Date Noted  . High cholesterol 04/03/2014  . Pelvic pain in female 03/02/2014  . Bilateral chronic knee pain 03/02/2014  . Bilateral elbow joint pain 03/02/2014   Past Medical History:  Diagnosis Date  . Allergy 2001  . Hyperlipidemia   . Migraine 2015    previously treated with propranolol   . Shingles 07/07/2012    Family History  Problem Relation Age of Onset  . Hypertension Mother   . Cancer Neg Hx   . Heart disease Neg Hx   . Breast cancer Neg Hx     Past Surgical History:  Procedure Laterality Date  . TUBAL LIGATION  1997    Social History   Occupational History  . Occupation: Runner, broadcasting/film/video: [employer]  Tobacco Use  . Smoking status: Never Smoker  . Smokeless tobacco: Never Used  Substance and Sexual Activity  . Alcohol use: No  . Drug use: No  . Sexual activity: Yes    Birth control/protection: None

## 2020-08-29 NOTE — Patient Instructions (Signed)
   Return to see Dr. Roda Shutters to discuss trigger finger surgery.    Voltaren Gel:  Apply 4 times daily to toes for swelling.

## 2020-09-05 ENCOUNTER — Ambulatory Visit: Payer: 59 | Admitting: Orthopaedic Surgery

## 2020-09-06 ENCOUNTER — Telehealth: Payer: Self-pay

## 2020-09-06 DIAGNOSIS — M65311 Trigger thumb, right thumb: Secondary | ICD-10-CM

## 2020-09-06 NOTE — Addendum Note (Signed)
Addended by: Lillia Carmel on: 09/06/2020 11:54 AM   Modules accepted: Orders

## 2020-09-06 NOTE — Telephone Encounter (Signed)
Pt aware.

## 2020-09-06 NOTE — Telephone Encounter (Signed)
Patient would like to be referred to PT for her finger issue.    She had appt scheduled with Roda Shutters yesterday but R/S to Monday to see Mardella Layman (since xu is out of the office next week) .

## 2020-09-06 NOTE — Telephone Encounter (Signed)
Can you advise her of this for me (since she would need an interpreter otherwise)?

## 2020-09-06 NOTE — Telephone Encounter (Signed)
Please advise (for right thumb).

## 2020-09-06 NOTE — Telephone Encounter (Signed)
OT orders placed

## 2020-09-10 ENCOUNTER — Ambulatory Visit: Payer: 59 | Admitting: Physician Assistant

## 2020-09-10 ENCOUNTER — Telehealth: Payer: Self-pay | Admitting: Orthopaedic Surgery

## 2020-09-10 NOTE — Telephone Encounter (Signed)
Can you add this to referral please, so that they can call daughter since patient does not speak english.

## 2020-09-10 NOTE — Telephone Encounter (Signed)
Patient's daughter Raquel called advised patient would like to try (PT) first to see if that works out for her.  Raquel said her mother do not wish to have surgery yet . Raquel asked if she can be called back because her mother do not speak english.   The number to contact Raquel is  [phone]

## 2020-09-12 NOTE — Telephone Encounter (Signed)
Note added to order

## 2020-11-13 ENCOUNTER — Emergency Department (HOSPITAL_COMMUNITY)
Admission: EM | Admit: 2020-11-13 | Discharge: 2020-11-13 | Disposition: A | Discharge status: Home / Self Care | Payer: 59 | Attending: Emergency Medicine | Admitting: Emergency Medicine

## 2020-11-13 ENCOUNTER — Ambulatory Visit (HOSPITAL_COMMUNITY)
Admission: EM | Admit: 2020-11-13 | Discharge: 2020-11-13 | Payer: 59 | Attending: Physician Assistant | Admitting: Physician Assistant

## 2020-11-13 ENCOUNTER — Encounter (HOSPITAL_COMMUNITY): Payer: Self-pay | Admitting: Emergency Medicine

## 2020-11-13 ENCOUNTER — Emergency Department (HOSPITAL_COMMUNITY): Payer: 59

## 2020-11-13 ENCOUNTER — Encounter (HOSPITAL_COMMUNITY): Payer: Self-pay

## 2020-11-13 ENCOUNTER — Other Ambulatory Visit: Payer: Self-pay

## 2020-11-13 DIAGNOSIS — M545 Low back pain, unspecified: Secondary | ICD-10-CM | POA: Diagnosis not present

## 2020-11-13 DIAGNOSIS — R1032 Left lower quadrant pain: Secondary | ICD-10-CM

## 2020-11-13 DIAGNOSIS — K5792 Diverticulitis of intestine, part unspecified, without perforation or abscess without bleeding: Secondary | ICD-10-CM | POA: Diagnosis not present

## 2020-11-13 DIAGNOSIS — R1030 Lower abdominal pain, unspecified: Secondary | ICD-10-CM | POA: Diagnosis not present

## 2020-11-13 LAB — CBC WITH DIFFERENTIAL/PLATELET
Abs Immature Granulocytes: 0.02 10*3/uL (ref 0.00–0.07)
Basophils Absolute: 0.1 10*3/uL (ref 0.0–0.1)
Basophils Relative: 1 %
Eosinophils Absolute: 0.3 10*3/uL (ref 0.0–0.5)
Eosinophils Relative: 3 %
HCT: 44.3 % (ref 36.0–46.0)
Hemoglobin: 14.1 g/dL (ref 12.0–15.0)
Immature Granulocytes: 0 %
Lymphocytes Relative: 23 %
Lymphs Abs: 2.2 10*3/uL (ref 0.7–4.0)
MCH: 29.2 pg (ref 26.0–34.0)
MCHC: 31.8 g/dL (ref 30.0–36.0)
MCV: 91.7 fL (ref 80.0–100.0)
Monocytes Absolute: 0.9 10*3/uL (ref 0.1–1.0)
Monocytes Relative: 10 %
Neutro Abs: 6.1 10*3/uL (ref 1.7–7.7)
Neutrophils Relative %: 63 %
Platelets: 258 10*3/uL (ref 150–400)
RBC: 4.83 MIL/uL (ref 3.87–5.11)
RDW: 12.8 % (ref 11.5–15.5)
WBC: 9.6 10*3/uL (ref 4.0–10.5)
nRBC: 0 % (ref 0.0–0.2)

## 2020-11-13 LAB — POCT URINALYSIS DIPSTICK, ED / UC
Bilirubin Urine: NEGATIVE
Glucose, UA: NEGATIVE mg/dL
Hgb urine dipstick: NEGATIVE
Ketones, ur: NEGATIVE mg/dL
Leukocytes,Ua: NEGATIVE
Nitrite: NEGATIVE
Protein, ur: NEGATIVE mg/dL
Specific Gravity, Urine: 1.025 (ref 1.005–1.030)
Urobilinogen, UA: 0.2 mg/dL (ref 0.0–1.0)
pH: 7 (ref 5.0–8.0)

## 2020-11-13 LAB — COMPREHENSIVE METABOLIC PANEL
ALT: 20 U/L (ref 0–44)
AST: 16 U/L (ref 15–41)
Albumin: 3.8 g/dL (ref 3.5–5.0)
Alkaline Phosphatase: 84 U/L (ref 38–126)
Anion gap: 8 (ref 5–15)
BUN: 14 mg/dL (ref 6–20)
CO2: 27 mmol/L (ref 22–32)
Calcium: 9.1 mg/dL (ref 8.9–10.3)
Chloride: 104 mmol/L (ref 98–111)
Creatinine, Ser: 0.75 mg/dL (ref 0.44–1.00)
GFR, Estimated: >60 mL/min (ref >60)
Glucose, Bld: 97 mg/dL (ref 70–99)
Potassium: 4.1 mmol/L (ref 3.5–5.1)
Sodium: 139 mmol/L (ref 135–145)
Total Bilirubin: 0.4 mg/dL (ref 0.3–1.2)
Total Protein: 6.6 g/dL (ref 6.5–8.1)

## 2020-11-13 LAB — LIPASE, BLOOD: Lipase: 30 U/L (ref 11–51)

## 2020-11-13 LAB — POC URINE PREG, ED: Preg Test, Ur: NEGATIVE

## 2020-11-13 MED ORDER — OXYCODONE-ACETAMINOPHEN 5-325 MG PO TABS
1.0000 | ORAL_TABLET | Freq: Once | ORAL | Status: AC
  Administered 2020-11-13: 1 via ORAL
  Filled 2020-11-13: qty 1

## 2020-11-13 MED ORDER — AMOXICILLIN-POT CLAVULANATE 875-125 MG PO TABS
1.0000 | ORAL_TABLET | Freq: Once | ORAL | Status: AC
  Administered 2020-11-13: 1 via ORAL
  Filled 2020-11-13: qty 1

## 2020-11-13 MED ORDER — OXYCODONE-ACETAMINOPHEN 5-325 MG PO TABS: 1.0000 | ORAL_TABLET | Freq: Three times a day (TID) | ORAL | 0 refills | Status: DC | PRN

## 2020-11-13 MED ORDER — AMOXICILLIN-POT CLAVULANATE 875-125 MG PO TABS: 1.0000 | ORAL_TABLET | Freq: Two times a day (BID) | ORAL | 0 refills | Status: DC

## 2020-11-13 NOTE — ED Provider Notes (Signed)
[facility]    CSN: [id number] Arrival date & time: 11/13/20  0920      History   Chief Complaint Chief Complaint  Patient presents with  . Abdominal Pain    HPI Rachel Newton is a 55 y.o. female.   Patient presents today with a 3-day history of worsening left lower quadrant abdominal pain.  Patient is Spanish-speaking and interpreter was utilized during visit.  She reports pain is generalized throughout her abdomen but is worse in the left lower quadrant.  Pain is rated 7 on a 0-10 pain scale, described as poking/sharp, no aggravating relieving factors identified.  She has tried ibuprofen without improvement of symptoms.  She denies any urinary symptoms including hematuria, dysuria, urinary frequency.  She denies any current vaginal discharge but does report an episode that occurred approximately 2 weeks ago.  She is not sexually active and has no concern for STI.  She has no longer having menstrual cycles; LMP approximately 8 years ago.  She denies any associated nausea, vomiting, melena, hematochezia.  She does report associated back pain that is worse on the left side and described as aching.  She denies history of gastrointestinal disorder including diverticulitis or Crohn's disease.  Only previous abdominal surgery is tubal ligation.     Past Medical History:  Diagnosis Date  . Allergy 2001  . Hyperlipidemia   . Migraine 2015   previously treated with propranolol   . Shingles 07/07/2012    Patient Active Problem List   Diagnosis Date Noted  . High cholesterol 04/03/2014  . Pelvic pain in female 03/02/2014  . Bilateral chronic knee pain 03/02/2014  . Bilateral elbow joint pain 03/02/2014    Past Surgical History:  Procedure Laterality Date  . TUBAL LIGATION  1997     OB History    Gravida  3   Para  3   Term  3   Preterm      AB      Living  3     SAB      IAB      Ectopic      Multiple      Live Births               Home  Medications    Prior to Admission medications   Medication Sig Start Date End Date Taking? Authorizing Provider  baclofen (LIORESAL) 10 MG tablet Take 0.5-1 tablets (5-10 mg total) by mouth 3 (three) times daily as needed for muscle spasms. 03/07/20   Hilts, Casimiro Needle, MD  cetirizine (ZYRTEC) 10 MG tablet Take 10 mg by mouth daily.  09/28/19   [provider]  diclofenac Sodium (VOLTAREN) 1 % GEL Apply 4 g topically 4 (four) times daily as needed. 01/24/20   Hilts, Casimiro Needle, MD  HYDROcodone-acetaminophen (NORCO/VICODIN) 5-325 MG tablet Take 1 tablet by mouth every 6 (six) hours as needed for moderate pain. 03/07/20   Hilts, Casimiro Needle, MD  meclizine (ANTIVERT) 25 MG tablet Take 1 tablet (25 mg total) by mouth 3 (three) times daily as needed for dizziness. 04/12/20   Cardama, Amadeo Garnet, MD  oxyCODONE-acetaminophen (PERCOCET) 5-325 MG tablet Take 1-2 tablets by mouth every 4 (four) hours as needed for severe pain. 07/18/20   Candelaria Stagers, DPM  traMADol (ULTRAM) 50 MG tablet Take 1 tablet (50 mg total) by mouth every 6 (six) hours as needed. 01/24/20   Hilts, Casimiro Needle, MD    Family History Family History  Problem Relation Age of  Onset  . Hypertension Mother   . Cancer Neg Hx   . Heart disease Neg Hx   . Breast cancer Neg Hx     Social History Social History   Tobacco Use  . Smoking status: Never Smoker  . Smokeless tobacco: Never Used  Substance Use Topics  . Alcohol use: No  . Drug use: No     Allergies   Meloxicam   Review of Systems Review of Systems  Constitutional: Positive for activity change. Negative for appetite change, fatigue and fever.  Respiratory: Negative for cough and shortness of breath.   Cardiovascular: Negative for chest pain.  Gastrointestinal: Positive for abdominal pain. Negative for blood in stool, constipation, diarrhea, nausea and vomiting.  Genitourinary: Negative for dysuria, frequency, hematuria, pelvic pain, urgency, vaginal bleeding, vaginal  discharge and vaginal pain.  Musculoskeletal: Negative for arthralgias and myalgias.  Neurological: Negative for dizziness, light-headedness and headaches.     Physical Exam Triage Vital Signs ED Triage Vitals  Enc Vitals Group     BP 11/13/20 1137 140/77     Pulse Rate 11/13/20 1137 86     Resp 11/13/20 1137 18     Temp 11/13/20 1137 98 F (36.7 C)     Temp Source 11/13/20 1137 Oral     SpO2 11/13/20 1137 100 %     Weight --      Height --      Head Circumference --      Peak Flow --      Pain Score 11/13/20 1134 6     Pain Loc --      Pain Edu? --      Excl. in GC? --    No data found.  Updated Vital Signs BP 140/77 (BP Location: Right Arm)   Pulse 86   Temp 98 F (36.7 C) (Oral)   Resp 18   SpO2 100%   Visual Acuity Right Eye Distance:   Left Eye Distance:   Bilateral Distance:    Right Eye Near:   Left Eye Near:    Bilateral Near:     Physical Exam Vitals reviewed.  Constitutional:      General: She is awake. She is not in acute distress.    Appearance: Normal appearance. She is not ill-appearing.     Comments: Very pleasant female appears stated age in no acute distress  HENT:     Head: Normocephalic and atraumatic.  Cardiovascular:     Rate and Rhythm: Normal rate and regular rhythm.     Heart sounds: No murmur heard.   Pulmonary:     Effort: Pulmonary effort is normal.     Breath sounds: Normal breath sounds. No wheezing, rhonchi or rales.     Comments: Clear to auscultation bilaterally Abdominal:     General: Bowel sounds are normal.     Palpations: Abdomen is soft.     Tenderness: There is abdominal tenderness in the suprapubic area and left lower quadrant. There is no right CVA tenderness, left CVA tenderness, guarding or rebound.     Comments: Significant tenderness palpation throughout lower abdomen worse in suprapubic and left lower quadrant.    Musculoskeletal:     Cervical back: No tenderness or bony tenderness.     Thoracic back: No  tenderness or bony tenderness.     Lumbar back: Tenderness present. No bony tenderness.     Comments: Tender palpation of left lumbar paraspinal muscles.  Psychiatric:  Behavior: Behavior is cooperative.      UC Treatments / Results  Labs (all labs ordered are listed, but only abnormal results are displayed) Labs Reviewed  POC URINE PREG, ED  POCT URINALYSIS DIPSTICK, ED / UC    EKG   Radiology No results found.  Procedures Procedures (including critical care time)  Medications Ordered in UC Medications - No data to display  Initial Impression / Assessment and Plan / UC Course  I have reviewed the triage vital signs and the nursing notes.  Pertinent labs & imaging results that were available during my care of the patient were reviewed by me and considered in my medical decision making (see chart for details).     Patient had significant tenderness on exam.  Discussed that we are unable to obtain imaging and so recommended she go to the emergency room to which she was agreeable to rule out diverticulitis versus ovarian cyst.  Patient will go directly to [facility], ER following discharge.  Vital signs stable at the time of discharge and patient safe for private transport.  Final Clinical Impressions(s) / UC Diagnoses   Final diagnoses:  LLQ pain  Lower abdominal pain  Acute left-sided low back pain without sciatica     Discharge Instructions     GO TO ER.     ED Prescriptions    None     PDMP not reviewed this encounter.   Jeani Hawking, PA-C 11/13/20 1253

## 2020-11-13 NOTE — Discharge Instructions (Addendum)
GO TO ER °

## 2020-11-13 NOTE — Discharge Instructions (Addendum)
Take pain medication as directed.  Take antibiotics as directed.  For the next few days, it would be helpful to engage in bowel rest.  This includes eating clear liquids, solids such as soups, broths, water, applesauce.  This will help settle some of the inflammation.  If it anytime, you feel that your pain is getting worse, you have a fever, you have persistent vomiting or any other worsening concerning symptoms, return to the emergency department immediately.

## 2020-11-13 NOTE — ED Triage Notes (Addendum)
Pt is being sent here from UC with c/o lower abdominal swelling with radiation down to pelvis and lower back. Denies blood in urine nor discharge. Urine collected at [facility], no other diagnostic testing done.

## 2020-11-13 NOTE — ED Provider Notes (Signed)
Emergency Medicine Provider Triage Evaluation Note  Rachel Newton , a 55 y.o. female  was evaluated in triage.  Pt complains of lower abd pain.  Been present for 3 days.  No history of similar.  No fevers, nausea, vomiting.  No urinary symptoms.  No blood in the stool.  Review of Systems  Positive: abd pain Negative: Fever, n/v/d  Physical Exam  BP 113/73 (BP Location: Left Arm)   Pulse 76   Temp 98.8 F (37.1 C)   Resp 16   SpO2 100%  Gen:   Awake, no distress   Resp:  Normal effort  MSK:   Moves extremities without difficulty  Other:  TTP of left lower quadrant abdomen.  Tenderness palpation bilateral low back musculature.  Medical Decision Making  Medically screening exam initiated at 1:57 PM.  Appropriate orders placed.  Rachel Newton was informed that the remainder of the evaluation will be completed by another provider, this initial triage assessment does not replace that evaluation, and the importance of remaining in the ED until their evaluation is complete.  Labs and ct ordered.   Alveria Apley, PA-C 11/13/20 1410    Pricilla Loveless, MD 11/14/20 [phone]

## 2020-11-13 NOTE — ED Provider Notes (Signed)
[facility] EMERGENCY DEPARTMENT Provider Note   CSN: [id number] Arrival date & time: 11/13/20  1315     History Chief Complaint  Patient presents with  . Abdominal Pain    Rachel Newton is a 55 y.o. female past ministry of hyperlipidemia, migraine who presents for evaluation of abdominal pain x3 days.  She states it is mostly in the lower abdomen but worse in the left lower quadrant.  She states she went to urgent care initially and he was sent over to the emergency department for further evaluation.  She states she has has not had any nausea/vomiting or diarrhea.  She has not noted any fever.  She denies any dysuria.  She has been able to eat and drink with any difficulty.  She denies any fevers, chest pain, difficulty breathing.  The history is provided by the patient. A language interpreter was used.       Past Medical History:  Diagnosis Date  . Allergy 2001  . Hyperlipidemia   . Migraine 2015   previously treated with propranolol   . Shingles 07/07/2012    Patient Active Problem List   Diagnosis Date Noted  . High cholesterol 04/03/2014  . Pelvic pain in female 03/02/2014  . Bilateral chronic knee pain 03/02/2014  . Bilateral elbow joint pain 03/02/2014    Past Surgical History:  Procedure Laterality Date  . TUBAL LIGATION  1997      OB History    Gravida  3   Para  3   Term  3   Preterm      AB      Living  3     SAB      IAB      Ectopic      Multiple      Live Births              Family History  Problem Relation Age of Onset  . Hypertension Mother   . Cancer Neg Hx   . Heart disease Neg Hx   . Breast cancer Neg Hx     Social History   Tobacco Use  . Smoking status: Never Smoker  . Smokeless tobacco: Never Used  Substance Use Topics  . Alcohol use: No  . Drug use: No    Home Medications Prior to Admission medications   Medication Sig Start Date End Date Taking? Authorizing Provider   amoxicillin-clavulanate (AUGMENTIN) 875-125 MG tablet Take 1 tablet by mouth every 12 (twelve) hours. 11/13/20  Yes Maxwell Caul, PA-C  oxyCODONE-acetaminophen (PERCOCET/ROXICET) 5-325 MG tablet Take 1 tablet by mouth every 8 (eight) hours as needed for severe pain. 11/13/20  Yes Maxwell Caul, PA-C  baclofen (LIORESAL) 10 MG tablet Take 0.5-1 tablets (5-10 mg total) by mouth 3 (three) times daily as needed for muscle spasms. 03/07/20   Hilts, Casimiro Needle, MD  cetirizine (ZYRTEC) 10 MG tablet Take 10 mg by mouth daily.  09/28/19   [provider]  diclofenac Sodium (VOLTAREN) 1 % GEL Apply 4 g topically 4 (four) times daily as needed. 01/24/20   Hilts, Casimiro Needle, MD  HYDROcodone-acetaminophen (NORCO/VICODIN) 5-325 MG tablet Take 1 tablet by mouth every 6 (six) hours as needed for moderate pain. 03/07/20   Hilts, Casimiro Needle, MD  meclizine (ANTIVERT) 25 MG tablet Take 1 tablet (25 mg total) by mouth 3 (three) times daily as needed for dizziness. 04/12/20   Nira Conn, MD  traMADol (ULTRAM) 50 MG tablet Take 1 tablet (  50 mg total) by mouth every 6 (six) hours as needed. 01/24/20   Hilts, Casimiro Needle, MD    Allergies    Meloxicam  Review of Systems   Review of Systems  Constitutional: Negative for fever.  Respiratory: Negative for cough and shortness of breath.   Cardiovascular: Negative for chest pain.  Gastrointestinal: Positive for abdominal pain. Negative for nausea and vomiting.  Genitourinary: Negative for dysuria and hematuria.  Neurological: Negative for headaches.  All other systems reviewed and are negative.   Physical Exam Updated Vital Signs BP 135/67 (BP Location: Right Arm)   Pulse 75   Temp 98.1 F (36.7 C) (Oral)   Resp 18   SpO2 98%   Physical Exam Vitals and nursing note reviewed.  Constitutional:      Appearance: Normal appearance. She is well-developed.     Comments: Well appearing, NAD  HENT:     Head: Normocephalic and atraumatic.  Eyes:      General: Lids are normal.     Conjunctiva/sclera: Conjunctivae normal.     Pupils: Pupils are equal, round, and reactive to light.  Cardiovascular:     Rate and Rhythm: Normal rate and regular rhythm.     Pulses: Normal pulses.     Heart sounds: Normal heart sounds. No murmur heard. No friction rub. No gallop.   Pulmonary:     Effort: Pulmonary effort is normal.     Breath sounds: Normal breath sounds.     Comments: Lungs clear to auscultation bilaterally.  Symmetric chest rise.  No wheezing, rales, rhonchi. Abdominal:     Palpations: Abdomen is soft. Abdomen is not rigid.     Tenderness: There is abdominal tenderness in the right lower quadrant, suprapubic area and left lower quadrant. There is no guarding.     Comments: Abdomen soft, nondistended.  Diffuse tenderness noted to lower abdomen, worse in the left lower quadrant.  No rigidity, guarding.  Musculoskeletal:        General: Normal range of motion.     Cervical back: Full passive range of motion without pain.  Skin:    General: Skin is warm and dry.     Capillary Refill: Capillary refill takes less than 2 seconds.  Neurological:     Mental Status: She is alert and oriented to person, place, and time.  Psychiatric:        Speech: Speech normal.     ED Results / Procedures / Treatments   Labs (all labs ordered are listed, but only abnormal results are displayed) Labs Reviewed  CBC WITH DIFFERENTIAL/PLATELET  COMPREHENSIVE METABOLIC PANEL  LIPASE, BLOOD    EKG None  Radiology CT ABDOMEN PELVIS WO CONTRAST  Result Date: 11/13/2020 CLINICAL DATA:  Lower abdominal pain x3 days. EXAM: CT ABDOMEN AND PELVIS WITHOUT CONTRAST TECHNIQUE: Multidetector CT imaging of the abdomen and pelvis was performed following the standard protocol without IV contrast. COMPARISON:  March 21, 2020 FINDINGS: Lower chest: No acute abnormality. Hepatobiliary: No focal liver abnormality is seen. No gallstones, gallbladder wall thickening, or  biliary dilatation. Pancreas: Unremarkable. No pancreatic ductal dilatation or surrounding inflammatory changes. Spleen: Normal in size without focal abnormality. Adrenals/Urinary Tract: Adrenal glands are unremarkable. Kidneys are normal, without renal calculi, focal lesion, or hydronephrosis. Bladder is unremarkable. Stomach/Bowel: Stomach is within normal limits. Appendix appears normal. No evidence of bowel dilatation. Markedly inflamed diverticula are seen within the proximal sigmoid colon. There is no evidence of associated perforation or abscess. Vascular/Lymphatic: Mild aortic atherosclerosis. No enlarged abdominal  or pelvic lymph nodes. Reproductive: Uterus and bilateral adnexa are unremarkable. Other: No abdominal wall hernia or abnormality. No abdominopelvic ascites. Musculoskeletal: No acute or significant osseous findings. IMPRESSION: Marked severity sigmoid diverticulitis. Electronically Signed   By: Aram Candela M.D.   On: 11/13/2020 15:05    Procedures Procedures   Medications Ordered in ED Medications  oxyCODONE-acetaminophen (PERCOCET/ROXICET) 5-325 MG per tablet 1 tablet (1 tablet Oral Given 11/13/20 1557)  oxyCODONE-acetaminophen (PERCOCET/ROXICET) 5-325 MG per tablet 1 tablet (1 tablet Oral Given 11/13/20 2150)  amoxicillin-clavulanate (AUGMENTIN) 875-125 MG per tablet 1 tablet (1 tablet Oral Given 11/13/20 2150)    ED Course  I have reviewed the triage vital signs and the nursing notes.  Pertinent labs & imaging results that were available during my care of the patient were reviewed by me and considered in my medical decision making (see chart for details).    MDM Rules/Calculators/A&P                          55 year old female who presents for evaluation of abdominal pain x3 days.  Reports diffuse lower abdominal pain, left lower quadrant worse than right.  No fevers, nausea/vomiting/diarrhea.  Initially went to urgent care and was sent to the ED for further  evaluation.  On initial ED arrival, she is afebrile, nontoxic-appearing.  Vitals are stable.  On exam, she has diffuse lower abdominal tenderness, worse in the left lower quadrant.  Consider diverticulitis versus infectious etiology.  Low suspicion for appendicitis but is a consideration.  History/physical exam x-ray for kidney stone.  Labs, imaging ordered at triage.  CMP is normal.  CBC shows no leukocytosis or anemia.  Lipase normal.  Due to national shortage of iodine contrast, CT on pelvis without contrast was ordered.  CT shows sigmoid diverticulitis that is markedly inflamed.  No evidence of associated perforation or abscess.  Bilateral adnexa are unremarkable.  Discussed results with patient via language interpreter.  At this time, patient is afebrile, hemodynamically stable.  Her pain has improved after receiving pain medication at triage.  She has no leukocytosis.  At this time, I feel that patient is appropriate for outpatient discharge with p.o. medication.  We will send her home with short course of pain medication, antibiotics.  She is allergic to meloxicam.  No other medications. At this time, patient exhibits no emergent life-threatening condition that require further evaluation in ED. Patient had ample opportunity for questions and discussion. All patient's questions were answered with full understanding. Strict return precautions discussed. Patient expresses understanding and agreement to plan.   Portions of this note were generated with Scientist, clinical (histocompatibility and immunogenetics). Dictation errors may occur despite best attempts at proofreading.   Final Clinical Impression(s) / ED Diagnoses Final diagnoses:  Diverticulitis    Rx / DC Orders ED Discharge Orders         Ordered    amoxicillin-clavulanate (AUGMENTIN) 875-125 MG tablet  Every 12 hours        11/13/20 2145    oxyCODONE-acetaminophen (PERCOCET/ROXICET) 5-325 MG tablet  Every 8 hours PRN        11/13/20 2145           Maxwell Caul, PA-C 11/13/20 2302    Palumbo, April, MD 11/13/20 2303

## 2020-11-13 NOTE — ED Notes (Signed)
Patient is being discharged from the Urgent Care and sent to the Emergency Department via personal vehicle. Per provider Dorann Ou, patient is in need of higher level of care due to needing further evaluation of abdominal pain. Patient is aware and verbalizes understanding of plan of care.   Vitals:   11/13/20 1137  BP: 140/77  Pulse: 86  Resp: 18  Temp: 98 F (36.7 C)  SpO2: 100%

## 2020-11-13 NOTE — ED Triage Notes (Signed)
Pt reports lower abdominal pain and swelling, back pain  x 3 days. Denies diarrhea, dysuria.

## 2021-04-14 ENCOUNTER — Encounter (HOSPITAL_COMMUNITY): Payer: Self-pay | Admitting: Emergency Medicine

## 2021-04-14 ENCOUNTER — Ambulatory Visit (HOSPITAL_COMMUNITY)
Admission: EM | Admit: 2021-04-14 | Discharge: 2021-04-14 | Disposition: A | Discharge status: Home / Self Care | Payer: 59 | Attending: Urgent Care | Admitting: Urgent Care

## 2021-04-14 ENCOUNTER — Other Ambulatory Visit: Payer: Self-pay

## 2021-04-14 DIAGNOSIS — K1379 Other lesions of oral mucosa: Secondary | ICD-10-CM | POA: Diagnosis not present

## 2021-04-14 DIAGNOSIS — K08409 Partial loss of teeth, unspecified cause, unspecified class: Secondary | ICD-10-CM

## 2021-04-14 MED ORDER — AMOXICILLIN-POT CLAVULANATE 875-125 MG PO TABS: 1.0000 | ORAL_TABLET | Freq: Two times a day (BID) | ORAL | 0 refills | Status: DC

## 2021-04-14 NOTE — Discharge Instructions (Signed)
[facility] [phone] extension [phone]  Dr. Civils [phone] [address]  [facility] [phone] [address]  [facility] [phone] extension [phone] [place], [place], [address] First come first serve for the first 10 clients.  May do simple extractions only, no wisdom teeth or surgery.  You may try the second for Thursday of the month starting at 6:30 AM.  [employer] You may call the school to see if they are still helping to provide dental care for emergent cases.  

## 2021-04-14 NOTE — ED Provider Notes (Signed)
[facility]   MRN: [id number] DOB: [birth year]  Subjective:   Rachel Newton is a 55 y.o. female presenting for 3 week history of persistent worsening right lower jaw/tooth pain. Had a wisdom tooth extraction in [place] but still has pain, radiates to her right ear, neck. Has used ibuprofen without relief. No fever, trismus, difficulty controlling secretions.   No current facility-administered medications for this encounter.  Current Outpatient Medications:    cetirizine (ZYRTEC) 10 MG tablet, Take 10 mg by mouth daily. , Disp: , Rfl:    baclofen (LIORESAL) 10 MG tablet, Take 0.5-1 tablets (5-10 mg total) by mouth 3 (three) times daily as needed for muscle spasms., Disp: 30 each, Rfl: 3   diclofenac Sodium (VOLTAREN) 1 % GEL, Apply 4 g topically 4 (four) times daily as needed., Disp: 500 g, Rfl: 6   HYDROcodone-acetaminophen (NORCO/VICODIN) 5-325 MG tablet, Take 1 tablet by mouth every 6 (six) hours as needed for moderate pain., Disp: 20 tablet, Rfl: 0   meclizine (ANTIVERT) 25 MG tablet, Take 1 tablet (25 mg total) by mouth 3 (three) times daily as needed for dizziness., Disp: 30 tablet, Rfl: 0   oxyCODONE-acetaminophen (PERCOCET/ROXICET) 5-325 MG tablet, Take 1 tablet by mouth every 8 (eight) hours as needed for severe pain., Disp: 8 tablet, Rfl: 0   traMADol (ULTRAM) 50 MG tablet, Take 1 tablet (50 mg total) by mouth every 6 (six) hours as needed., Disp: 20 tablet, Rfl: 0   Allergies  Allergen Reactions   Meloxicam Hives    Past Medical History:  Diagnosis Date   Allergy 2001   Hyperlipidemia    Migraine 2015   previously treated with propranolol    Shingles 07/07/2012     Past Surgical History:  Procedure Laterality Date   TUBAL LIGATION  1997     Family History  Problem Relation Age of Onset   Hypertension Mother    Cancer Neg Hx    Heart disease Neg Hx    Breast cancer Neg Hx     Social History   Tobacco Use   Smoking status: Never    Smokeless tobacco: Never  Substance Use Topics   Alcohol use: No   Drug use: No    ROS   Objective:   Vitals: BP 133/83   Pulse 77   Temp 98.6 F (37 C) (Oral)   Resp 16   SpO2 96%   Physical Exam Constitutional:      General: She is not in acute distress.    Appearance: She is well-developed. She is not ill-appearing, toxic-appearing or diaphoretic.  HENT:     Head: Normocephalic and atraumatic.     Right Ear: Tympanic membrane, ear canal and external ear normal. No drainage or tenderness. No middle ear effusion. There is no impacted cerumen. Tympanic membrane is not erythematous.     Left Ear: Tympanic membrane, ear canal and external ear normal. No drainage or tenderness.  No middle ear effusion. There is no impacted cerumen. Tympanic membrane is not erythematous.     Nose: No congestion or rhinorrhea.     Mouth/Throat:     Mouth: Mucous membranes are moist. No oral lesions.     Pharynx: Oropharynx is clear. No pharyngeal swelling, oropharyngeal exudate, posterior oropharyngeal erythema or uvula swelling.     Tonsils: No tonsillar exudate or tonsillar abscesses.   Eyes:     General: No scleral icterus.       Right eye: No discharge.  Left eye: No discharge.     Extraocular Movements: Extraocular movements intact.     Right eye: Normal extraocular motion.     Left eye: Normal extraocular motion.     Conjunctiva/sclera: Conjunctivae normal.     Pupils: Pupils are equal, round, and reactive to light.  Cardiovascular:     Rate and Rhythm: Normal rate.  Pulmonary:     Effort: Pulmonary effort is normal.  Musculoskeletal:     Cervical back: Normal range of motion and neck supple.  Lymphadenopathy:     Cervical: No cervical adenopathy.  Skin:    General: Skin is warm and dry.  Neurological:     General: No focal deficit present.     Mental Status: She is alert and oriented to person, place, and time.  Psychiatric:        Mood and Affect: Mood normal.         Behavior: Behavior normal.        Thought Content: Thought content normal.        Judgment: Judgment normal.     Assessment and Plan :   PDMP not reviewed this encounter.  1. Oral pain   2. History of third molar tooth extraction, unspecified edentulism class    Will address infection with Augmentin, use ibuprofen or naproxen for pain and inflammation. Follow up with dentist. Counseled patient on potential for adverse effects with medications prescribed/recommended today, ER and return-to-clinic precautions discussed, patient verbalized understanding.    Wallis Bamberg, PA-C 04/14/21 1625

## 2021-04-14 NOTE — ED Triage Notes (Signed)
   3 weeks ago she had a molar surgery in [place], pain extends from ear to chin.

## 2021-07-04 ENCOUNTER — Encounter (HOSPITAL_COMMUNITY): Payer: Self-pay

## 2021-07-04 ENCOUNTER — Other Ambulatory Visit: Payer: Self-pay

## 2021-07-04 ENCOUNTER — Ambulatory Visit (HOSPITAL_COMMUNITY)
Admission: EM | Admit: 2021-07-04 | Discharge: 2021-07-04 | Disposition: A | Discharge status: Home / Self Care | Payer: 59 | Attending: Emergency Medicine | Admitting: Emergency Medicine

## 2021-07-04 DIAGNOSIS — K047 Periapical abscess without sinus: Secondary | ICD-10-CM | POA: Diagnosis not present

## 2021-07-04 DIAGNOSIS — R051 Acute cough: Secondary | ICD-10-CM | POA: Diagnosis not present

## 2021-07-04 MED ORDER — PROMETHAZINE-DM 6.25-15 MG/5ML PO SYRP: 5.0000 mL | ORAL_SOLUTION | Freq: Four times a day (QID) | ORAL | 0 refills | Status: AC | PRN

## 2021-07-04 MED ORDER — AMOXICILLIN-POT CLAVULANATE 875-125 MG PO TABS: 1.0000 | ORAL_TABLET | Freq: Two times a day (BID) | ORAL | 0 refills | Status: DC

## 2021-07-04 MED ORDER — BENZONATATE 100 MG PO CAPS: 100.0000 mg | ORAL_CAPSULE | Freq: Three times a day (TID) | ORAL | 0 refills | Status: AC

## 2021-07-04 NOTE — Discharge Instructions (Signed)
I believe your symptoms are being caused by a possible infection at your tooth, therefore I will place you on antibiotic which will help resolve the pain and swelling and sensation of lump that you are feeling  Take Augmentin twice a day for the next 7 days  For your cough which is most likely being caused by a virus  You may use Tessalon every 8 hours as needed  You may use cough syrup every 6 hours as needed, be mindful this medication may make you sleepy if this occurs you may take it just bedtime  Maintaining adequate hydration may help to thin secretions and soothe the respiratory mucosa   Warm Liquids- Ingestion of warm liquids may have a soothing effect on the respiratory mucosa, increase the flow of nasal mucus, and loosen respiratory secretions, making them easier to remove  May try honey (2.5 to 5 mL [0.5 to 1 teaspoon]) can be given straight or diluted in liquid (juice). Corn syrup may be substituted if honey is not available.

## 2021-07-04 NOTE — ED Triage Notes (Signed)
Pt reports pain to the L ear x 1 week.   States she has pain when eating. States she took Ibuprofen for pain relief.   States she started a dry cough this morning.

## 2021-07-04 NOTE — ED Provider Notes (Signed)
[facility]    CSN: [id number] Arrival date & time: 07/04/21  1302      History   Chief Complaint Chief Complaint  Patient presents with   Cough   Ear Pain    HPI Rachel BouchardMaria C Newton is a 56 y.o. female.   Patient presents with left-sided ear pain radiating from jaw with left-sided facial swelling for 1 week.  Pain can be felt when talking, chewing and opening and closing mouth.  Endorses recent extraction of tooth.  Denies difficulty swallowing, difficulty breathing, shortness of breath, wheezing.  Persistent nonproductive cough started 1 day ago and is causing chest pain upper back soreness.  Has not attempted treatment of symptoms.  No known sick contacts.  History of hyperlipidemia, allergies, migraines.  Denies fever, chills, body aches, sore throat,, ear drainage or Itching, decreased hearing,, headaches.  Past Medical History:  Diagnosis Date   Allergy 2001   Hyperlipidemia    Migraine 2015   previously treated with propranolol    Shingles 07/07/2012    Patient Active Problem List   Diagnosis Date Noted   High cholesterol 04/03/2014   Pelvic pain in female 03/02/2014   Bilateral chronic knee pain 03/02/2014   Bilateral elbow joint pain 03/02/2014    Past Surgical History:  Procedure Laterality Date   TUBAL LIGATION  1997     OB History     Gravida  3   Para  3   Term  3   Preterm      AB      Living  3      SAB      IAB      Ectopic      Multiple      Live Births               Home Medications    Prior to Admission medications   Medication Sig Start Date End Date Taking? Authorizing Provider  amoxicillin-clavulanate (AUGMENTIN) 875-125 MG tablet Take 1 tablet by mouth every 12 (twelve) hours. 04/14/21   Wallis BambergMani, Mario, PA-C  baclofen (LIORESAL) 10 MG tablet Take 0.5-1 tablets (5-10 mg total) by mouth 3 (three) times daily as needed for muscle spasms. 03/07/20   Hilts, Casimiro NeedleMichael, MD  cetirizine (ZYRTEC) 10 MG tablet Take 10  mg by mouth daily.  09/28/19   [provider]  diclofenac Sodium (VOLTAREN) 1 % GEL Apply 4 g topically 4 (four) times daily as needed. 01/24/20   Hilts, Casimiro NeedleMichael, MD  HYDROcodone-acetaminophen (NORCO/VICODIN) 5-325 MG tablet Take 1 tablet by mouth every 6 (six) hours as needed for moderate pain. 03/07/20   Hilts, Casimiro NeedleMichael, MD  meclizine (ANTIVERT) 25 MG tablet Take 1 tablet (25 mg total) by mouth 3 (three) times daily as needed for dizziness. 04/12/20   Cardama, Amadeo GarnetPedro Eduardo, MD  oxyCODONE-acetaminophen (PERCOCET/ROXICET) 5-325 MG tablet Take 1 tablet by mouth every 8 (eight) hours as needed for severe pain. 11/13/20   Maxwell CaulLayden, Lindsey A, PA-C  traMADol (ULTRAM) 50 MG tablet Take 1 tablet (50 mg total) by mouth every 6 (six) hours as needed. 01/24/20   Hilts, Michael, MD    Family History Family History  Problem Relation Age of Onset   Hypertension Mother    Cancer Neg Hx    Heart disease Neg Hx    Breast cancer Neg Hx     Social History Social History   Tobacco Use   Smoking status: Never   Smokeless tobacco: Never  Substance Use Topics  Alcohol use: No   Drug use: No     Allergies   Meloxicam   Review of Systems Review of Systems  Constitutional: Negative.   HENT:  Positive for ear pain and facial swelling. Negative for congestion, dental problem, drooling, ear discharge, hearing loss, mouth sores, nosebleeds, postnasal drip, rhinorrhea, sinus pressure, sinus pain, sneezing, sore throat, tinnitus, trouble swallowing and voice change.   Respiratory:  Positive for cough. Negative for apnea, choking, chest tightness, shortness of breath, wheezing and stridor.   Cardiovascular: Negative.   Gastrointestinal: Negative.   Skin: Negative.   Neurological: Negative.     Physical Exam Triage Vital Signs ED Triage Vitals  Enc Vitals Group     BP 07/04/21 1536 (!) 174/83     Pulse Rate 07/04/21 1536 60     Resp 07/04/21 1539 17     Temp 07/04/21 1536 98.2 F (36.8 C)      Temp Source 07/04/21 1536 Oral     SpO2 07/04/21 1536 98 %     Weight --      Height --      Head Circumference --      Peak Flow --      Pain Score 07/04/21 1534 7     Pain Loc --      Pain Edu? --      Excl. in [place]? --    No data found.  Updated Vital Signs BP (!) 174/83 (BP Location: Left Arm)    Pulse 60    Temp 98.2 F (36.8 C) (Oral)    Resp 17    SpO2 98%   Visual Acuity Right Eye Distance:   Left Eye Distance:   Bilateral Distance:    Right Eye Near:   Left Eye Near:    Bilateral Near:     Physical Exam Constitutional:      Appearance: Normal appearance.  HENT:     Head: Normocephalic.     Jaw: No tenderness, swelling or pain on movement.     Right Ear: Tympanic membrane, ear canal and external ear normal.     Left Ear: Tympanic membrane, ear canal and external ear normal.     Nose: Nose normal.     Mouth/Throat:     Mouth: Mucous membranes are moist.     Pharynx: Oropharynx is clear.     Comments:  Dental abscess present along the lower left gumline where tooth is missing, mild gingival swelling noted Eyes:     Extraocular Movements: Extraocular movements intact.  Pulmonary:     Effort: Pulmonary effort is normal.     Breath sounds: Normal breath sounds.  Skin:    General: Skin is warm and dry.  Neurological:     Mental Status: She is alert and oriented to person, place, and time. Mental status is at baseline.  Psychiatric:        Mood and Affect: Mood normal.        Behavior: Behavior normal.     UC Treatments / Results  Labs (all labs ordered are listed, but only abnormal results are displayed) Labs Reviewed - No data to display  EKG   Radiology No results found.  Procedures Procedures (including critical care time)  Medications Ordered in UC Medications - No data to display  Initial Impression / Assessment and Plan / UC Course  I have reviewed the triage vital signs and the nursing notes.  Pertinent labs & imaging results that were  available during my  care of the patient were reviewed by me and considered in my medical decision making (see chart for details).  Dental abscess Acute cough  1.  Augmentin 875-125 twice daily for 7 days, recommended follow-up with dentist for persistent symptoms, may use over-the-counter medications for pain management 2.  Etiology of cough most likely viral, will defer viral testing at this time as cough is only symptom and manage conservatively, lungs are clear on exam and O2 saturations are 98% on room air,Tessalon and Promethazine DM prescribed, patient given return precautions to follow-up for worsening signs of breathing as needed urgent care Final Clinical Impressions(s) / UC Diagnoses   Final diagnoses:  None   Discharge Instructions   None    ED Prescriptions   None    PDMP not reviewed this encounter.   Valinda Hoar, [place] 07/05/21 [phone]

## 2021-09-19 ENCOUNTER — Other Ambulatory Visit: Payer: Self-pay

## 2021-09-19 ENCOUNTER — Ambulatory Visit (HOSPITAL_COMMUNITY)
Admission: EM | Admit: 2021-09-19 | Discharge: 2021-09-19 | Disposition: A | Discharge status: Home / Self Care | Payer: 59 | Attending: Emergency Medicine | Admitting: Emergency Medicine

## 2021-09-19 ENCOUNTER — Emergency Department (HOSPITAL_COMMUNITY): Payer: 59

## 2021-09-19 ENCOUNTER — Encounter (HOSPITAL_COMMUNITY): Payer: Self-pay | Admitting: Emergency Medicine

## 2021-09-19 ENCOUNTER — Emergency Department (HOSPITAL_COMMUNITY)
Admission: EM | Admit: 2021-09-19 | Discharge: 2021-09-19 | Disposition: A | Discharge status: Home / Self Care | Payer: 59 | Attending: Emergency Medicine | Admitting: Emergency Medicine

## 2021-09-19 DIAGNOSIS — R059 Cough, unspecified: Secondary | ICD-10-CM | POA: Diagnosis not present

## 2021-09-19 DIAGNOSIS — M549 Dorsalgia, unspecified: Secondary | ICD-10-CM | POA: Insufficient documentation

## 2021-09-19 DIAGNOSIS — R079 Chest pain, unspecified: Secondary | ICD-10-CM

## 2021-09-19 LAB — TROPONIN I (HIGH SENSITIVITY): Troponin I (High Sensitivity): 3 ng/L (ref <18)

## 2021-09-19 LAB — CBC WITH DIFFERENTIAL/PLATELET
Abs Immature Granulocytes: 0.01 10*3/uL (ref 0.00–0.07)
Basophils Absolute: 0.1 10*3/uL (ref 0.0–0.1)
Basophils Relative: 1 %
Eosinophils Absolute: 0.2 10*3/uL (ref 0.0–0.5)
Eosinophils Relative: 3 %
HCT: 43.5 % (ref 36.0–46.0)
Hemoglobin: 14.3 g/dL (ref 12.0–15.0)
Immature Granulocytes: 0 %
Lymphocytes Relative: 46 %
Lymphs Abs: 2.6 10*3/uL (ref 0.7–4.0)
MCH: 29.4 pg (ref 26.0–34.0)
MCHC: 32.9 g/dL (ref 30.0–36.0)
MCV: 89.3 fL (ref 80.0–100.0)
Monocytes Absolute: 0.4 10*3/uL (ref 0.1–1.0)
Monocytes Relative: 7 %
Neutro Abs: 2.5 10*3/uL (ref 1.7–7.7)
Neutrophils Relative %: 43 %
Platelets: 265 10*3/uL (ref 150–400)
RBC: 4.87 MIL/uL (ref 3.87–5.11)
RDW: 13 % (ref 11.5–15.5)
WBC: 5.8 10*3/uL (ref 4.0–10.5)
nRBC: 0 % (ref 0.0–0.2)

## 2021-09-19 LAB — BASIC METABOLIC PANEL
Anion gap: 7 (ref 5–15)
BUN: 12 mg/dL (ref 6–20)
CO2: 25 mmol/L (ref 22–32)
Calcium: 9.3 mg/dL (ref 8.9–10.3)
Chloride: 108 mmol/L (ref 98–111)
Creatinine, Ser: 0.69 mg/dL (ref 0.44–1.00)
GFR, Estimated: >60 mL/min (ref >60)
Glucose, Bld: 104 mg/dL — ABNORMAL HIGH (ref 70–99)
Potassium: 3.9 mmol/L (ref 3.5–5.1)
Sodium: 140 mmol/L (ref 135–145)

## 2021-09-19 NOTE — ED Provider Notes (Signed)
?[facility] ? ? ? ?CSN: [id number] ?Arrival date & time: 09/19/21  1112 ? ? ?  ? ?History   ?Chief Complaint ?Chief Complaint  ?Patient presents with  ? Chest Pain  ? Arm Pain  ? Cough  ? ? ?HPI ?Rachel Newton is a 56 y.o. female.  ? ?Patient presents with left-sided chest pain radiating down the arm into the elbow and into the left side of back for occurring intermittently for 3 weeks.  Endorses that this morning pain became constant with associated lightheadedness,  persisting throughout the day.  Associated shortness of breath occurring intermittently at rest.  Initially dizziness was present but has resolved.History of hyperlipidemia per chart, not currently taking medicine.  Denies further cardiac history, familial history of hypertension.  ? ?-Interpreter used for entirety of exam ? ? ? ?Past Medical History:  ?Diagnosis Date  ? Allergy 2001  ? Hyperlipidemia   ? Migraine 2015  ? previously treated with propranolol   ? Shingles 07/07/2012  ? ? ?Patient Active Problem List  ? Diagnosis Date Noted  ? High cholesterol 04/03/2014  ? Pelvic pain in female 03/02/2014  ? Bilateral chronic knee pain 03/02/2014  ? Bilateral elbow joint pain 03/02/2014  ? ? ?Past Surgical History:  ?Procedure Laterality Date  ? TUBAL LIGATION  1997   ? ? ?OB History   ? ? Gravida  ?3  ? Para  ?3  ? Term  ?3  ? Preterm  ?   ? AB  ?   ? Living  ?3  ?  ? ? SAB  ?   ? IAB  ?   ? Ectopic  ?   ? Multiple  ?   ? Live Births  ?   ?   ?  ?  ? ? ? ?Home Medications   ? ?Prior to Admission medications   ?Medication Sig Start Date End Date Taking? Authorizing Provider  ?amoxicillin-clavulanate (AUGMENTIN) 875-125 MG tablet Take 1 tablet by mouth every 12 (twelve) hours. 07/04/21   Hans Eden, NP  ?baclofen (LIORESAL) 10 MG tablet Take 0.5-1 tablets (5-10 mg total) by mouth 3 (three) times daily as needed for muscle spasms. 03/07/20   Hilts, Legrand Como, MD  ?benzonatate (TESSALON) 100 MG capsule Take 1 capsule (100 mg total) by  mouth every 8 (eight) hours. 07/04/21   Hans Eden, NP  ?cetirizine (ZYRTEC) 10 MG tablet Take 10 mg by mouth daily.  09/28/19   [provider]  ?diclofenac Sodium (VOLTAREN) 1 % GEL Apply 4 g topically 4 (four) times daily as needed. 01/24/20   Hilts, Legrand Como, MD  ?HYDROcodone-acetaminophen (NORCO/VICODIN) 5-325 MG tablet Take 1 tablet by mouth every 6 (six) hours as needed for moderate pain. 03/07/20   Hilts, Legrand Como, MD  ?meclizine (ANTIVERT) 25 MG tablet Take 1 tablet (25 mg total) by mouth 3 (three) times daily as needed for dizziness. 04/12/20   Fatima Blank, MD  ?oxyCODONE-acetaminophen (PERCOCET/ROXICET) 5-325 MG tablet Take 1 tablet by mouth every 8 (eight) hours as needed for severe pain. 11/13/20   Volanda Napoleon, PA-C  ?promethazine-dextromethorphan (PROMETHAZINE-DM) 6.25-15 MG/5ML syrup Take 5 mLs by mouth 4 (four) times daily as needed for cough. 07/04/21   Hans Eden, NP  ?traMADol (ULTRAM) 50 MG tablet Take 1 tablet (50 mg total) by mouth every 6 (six) hours as needed. 01/24/20   Hilts, Legrand Como, MD  ? ? ?Family History ?Family History  ?Problem Relation Age of Onset  ? Hypertension  Mother   ? Cancer Neg Hx   ? Heart disease Neg Hx   ? Breast cancer Neg Hx   ? ? ?Social History ?Social History  ? ?Tobacco Use  ? Smoking status: Never  ? Smokeless tobacco: Never  ?Substance Use Topics  ? Alcohol use: No  ? Drug use: No  ? ? ? ?Allergies   ?Meloxicam ? ? ?Review of Systems ?Review of Systems  ?Respiratory:  Positive for cough.   ?Cardiovascular:  Positive for chest pain.  ? ? ?Physical Exam ?Triage Vital Signs ?ED Triage Vitals  ?Enc Vitals Group  ?   BP 09/19/21 1218 (!) 143/84  ?   Pulse Rate 09/19/21 1218 70  ?   Resp 09/19/21 1218 17  ?   Temp 09/19/21 1218 97.8 ?F (36.6 ?C)  ?   Temp Source 09/19/21 1218 Oral  ?   SpO2 09/19/21 1218 97 %  ?   Weight --   ?   Height --   ?   Head Circumference --   ?   Peak Flow --   ?   Pain Score 09/19/21 1219 5  ?   Pain Loc --   ?    Pain Edu? --   ?   Excl. in [place]? --   ? ?No data found. ? ?Updated Vital Signs ?BP (!) 143/84 (BP Location: Right Arm)   Pulse 70   Temp 97.8 ?F (36.6 ?C) (Oral)   Resp 17   SpO2 97%  ? ?Visual Acuity ?Right Eye Distance:   ?Left Eye Distance:   ?Bilateral Distance:   ? ?Right Eye Near:   ?Left Eye Near:    ?Bilateral Near:    ? ?Physical Exam ? ? ?UC Treatments / Results  ?Labs ?(all labs ordered are listed, but only abnormal results are displayed) ?Labs Reviewed - No data to display ? ?EKG ? ? ?Radiology ?No results found. ? ?Procedures ?Procedures (including critical care time) ? ?Medications Ordered in UC ?Medications - No data to display ? ?Initial Impression / Assessment and Plan / UC Course  ?I have reviewed the triage vital signs and the nursing notes. ? ?Pertinent labs & imaging results that were available during my care of the patient were reviewed by me and considered in my medical decision making (see chart for details). ? ?Chest pain ? ?Patient sent to the nearest emergency department for further evaluation of active chest pain and cardiac involvement, EKG showing normal sinus rhythm and patient in no signs of distress, able to escort self to ED. ?Final Clinical Impressions(s) / UC Diagnoses  ? ?Final diagnoses:  ?None  ? ?Discharge Instructions   ?None ?  ? ?ED Prescriptions   ?None ?  ? ?PDMP not reviewed this encounter. ?  ?Hans Eden, NP ?09/19/21 1254 ? ?

## 2021-09-19 NOTE — ED Provider Triage Note (Signed)
Emergency Medicine Provider Triage Evaluation Note ? ?Rachel Newton , a 56 y.o. female  was evaluated in triage.  Pt complains of vaginal onset, intermittent, left-sided chest pain that began 3 weeks ago.  She also endorses mild shortness of breath with deep inspiration and nausea.  She was seen at urgent care earlier today and advised to come to the ED for further eval.  She denies any issues with her heart in the past.  She is a never smoker.  Denies any family history of CAD.  ? ?Review of Systems  ?Positive: + chest pain, SOB, nausea ?Negative: - vomiting, diaphoresis ? ?Physical Exam  ?BP 133/73 (BP Location: Right Arm)   Pulse 68   Temp 98 ?F (36.7 ?C) (Oral)   Resp 14   Ht 5' (1.524 m)   Wt 84.8 kg   SpO2 100%   BMI 36.52 kg/m?  ?Gen:   Awake, no distress   ?Resp:  Normal effort  ?MSK:   Moves extremities without difficulty  ?Other:   ? ?Medical Decision Making  ?Medically screening exam initiated at 1:24 PM.  Appropriate orders placed.  Rachel Newton was informed that the remainder of the evaluation will be completed by another provider, this initial triage assessment does not replace that evaluation, and the importance of remaining in the ED until their evaluation is complete. ? ? ?  ?Tanda Rockers, PA-C ?09/19/21 1325 ? ?

## 2021-09-19 NOTE — ED Notes (Signed)
Patient is being discharged from the Urgent Care and sent to the Emergency Department via POV . Per Salli Quarry, NP, patient is in need of higher level of care due to left sided chest pains that radiates to arm and back. Patient is aware and verbalizes understanding of plan of care.  ?Vitals:  ? 09/19/21 1218  ?BP: (!) 143/84  ?Pulse: 70  ?Resp: 17  ?Temp: 97.8 ?F (36.6 ?C)  ?SpO2: 97%  ? ? ?

## 2021-09-19 NOTE — ED Triage Notes (Signed)
Pt c/o left chest pains that radiates to back and left arm that has been intermittent over past 3 weeks. Pt reports pain is worse with cough or taking deep breath  ?

## 2021-09-19 NOTE — Discharge Instructions (Signed)
Take 4 over the counter ibuprofen tablets 3 times a day or 2 over-the-counter naproxen tablets twice a day for pain. ?Also take tylenol 1000mg (2 extra strength) four times a day.  ? ?Your workup did not show any signs concerning for heart attack or pneumonia.  Please return for worsening pain if you start coughing up blood or if you pass out.  Please follow-up with your family doctor in the office. ? ?Most likely I think you have strained a muscle.  Try not to lift greater than 10 pounds bend or twist at the waist as best you can for the next week.   ?   ?Tome 4 tabletas de ibuprofeno de venta libre 3 veces al d?a o 2 tabletas de naproxeno de venta al d?a para [employer]. ?Tambi?n tome tylenol 1000 mg (2 extra fuerte) cuatro veces al d?a. ? ?Su estudio no mostr? signos de infarto o neumon?a. Regrese si el dolor empeora si comienza a toser sangre o si se desmaya. Por favor, haga un seguimiento con su m?dico de cabecera en el consultorio. ? ? ?Lo m?s probable es que crea que te has torcido un m?sculo. Trate de no levantar m?s de 10 libras, doble o tuerza la cintura lo mejor que pueda durante la pr?xima semana. ? ?

## 2021-09-19 NOTE — ED Notes (Signed)
Discharge instructions and follow up care reviewed and explained , pt verbalized understanding and had no additional questions/concerns. ?

## 2021-09-19 NOTE — ED Triage Notes (Signed)
Using medical interpreter- patient sta\tes she was sent by UC for further evaluation of left sided chest pain radiating into back and left arm. Pain started 3 weeks ago and has been intermittent with worsening this morning. Describes pain as squeezing and pressure. Pain worse when walking or coughing.  ?

## 2021-09-19 NOTE — ED Provider Notes (Signed)
?[facility] EMERGENCY DEPARTMENT ?Provider Note ? ? ?CSN: [id number] ?Arrival date & time: 09/19/21  1256 ? ?  ? ?History ? ?Chief Complaint  ?Patient presents with  ? Chest Pain  ? ? ?Rachel Newton is a 56 y.o. female. ? ?56 yo F with a chief complaints of back pain.  This is been going on for about 3 weeks.  Seems to come and go.  She went to urgent care and they were concerned and sent her here for evaluation.  She is unsure what seems to make it better or worse.  Has had a little bit of a cough.  No fevers.  Denies trauma. ? ? ?Chest Pain ? ?  ? ?Home Medications ?Prior to Admission medications   ?Medication Sig Start Date End Date Taking? Authorizing Provider  ?amoxicillin-clavulanate (AUGMENTIN) 875-125 MG tablet Take 1 tablet by mouth every 12 (twelve) hours. 07/04/21   Valinda Hoar, NP  ?baclofen (LIORESAL) 10 MG tablet Take 0.5-1 tablets (5-10 mg total) by mouth 3 (three) times daily as needed for muscle spasms. 03/07/20   Hilts, Casimiro Needle, MD  ?benzonatate (TESSALON) 100 MG capsule Take 1 capsule (100 mg total) by mouth every 8 (eight) hours. 07/04/21   Valinda Hoar, NP  ?cetirizine (ZYRTEC) 10 MG tablet Take 10 mg by mouth daily.  09/28/19   [provider]  ?diclofenac Sodium (VOLTAREN) 1 % GEL Apply 4 g topically 4 (four) times daily as needed. 01/24/20   Hilts, Casimiro Needle, MD  ?HYDROcodone-acetaminophen (NORCO/VICODIN) 5-325 MG tablet Take 1 tablet by mouth every 6 (six) hours as needed for moderate pain. 03/07/20   Hilts, Casimiro Needle, MD  ?meclizine (ANTIVERT) 25 MG tablet Take 1 tablet (25 mg total) by mouth 3 (three) times daily as needed for dizziness. 04/12/20   Nira Conn, MD  ?oxyCODONE-acetaminophen (PERCOCET/ROXICET) 5-325 MG tablet Take 1 tablet by mouth every 8 (eight) hours as needed for severe pain. 11/13/20   Maxwell Caul, PA-C  ?promethazine-dextromethorphan (PROMETHAZINE-DM) 6.25-15 MG/5ML syrup Take 5 mLs by mouth 4 (four) times daily as  needed for cough. 07/04/21   Valinda Hoar, NP  ?traMADol (ULTRAM) 50 MG tablet Take 1 tablet (50 mg total) by mouth every 6 (six) hours as needed. 01/24/20   Hilts, Casimiro Needle, MD  ?   ? ?Allergies    ?Meloxicam   ? ?Review of Systems   ?Review of Systems  ?Cardiovascular:  Positive for chest pain.  ? ?Physical Exam ?Updated Vital Signs ?BP 120/71 (BP Location: Right Arm)   Pulse 64   Temp 98.5 ?F (36.9 ?C) (Oral)   Resp 16   Ht 5' (1.524 m)   Wt 84.8 kg   SpO2 98%   BMI 36.52 kg/m?  ?Physical Exam ?Vitals and nursing note reviewed.  ?Constitutional:   ?   General: She is not in acute distress. ?   Appearance: She is well-developed. She is not diaphoretic.  ?HENT:  ?   Head: Normocephalic and atraumatic.  ?Eyes:  ?   Pupils: Pupils are equal, round, and reactive to light.  ?Cardiovascular:  ?   Rate and Rhythm: Normal rate and regular rhythm.  ?   Heart sounds: No murmur heard. ?  No friction rub. No gallop.  ?Pulmonary:  ?   Effort: Pulmonary effort is normal.  ?   Breath sounds: No wheezing or rales.  ?Chest:  ?   Chest wall: Tenderness present.  ?   Comments: Tenderness over the left anterior  chest just adjacent to the sternum about ribs 4 through 6 reproduces her pain.  She also has focal back tenderness just below the tip of the scapula. ?Abdominal:  ?   General: There is no distension.  ?   Palpations: Abdomen is soft.  ?   Tenderness: There is no abdominal tenderness.  ?Musculoskeletal:     ?   General: No tenderness.  ?   Cervical back: Normal range of motion and neck supple.  ?Skin: ?   General: Skin is warm and dry.  ?Neurological:  ?   Mental Status: She is alert and oriented to person, place, and time.  ?Psychiatric:     ?   Behavior: Behavior normal.  ? ? ?ED Results / Procedures / Treatments   ?Labs ?(all labs ordered are listed, but only abnormal results are displayed) ?Labs Reviewed  ?BASIC METABOLIC PANEL - Abnormal; Notable for the following components:  ?    Result Value  ? Glucose, Bld 104  (*)   ? All other components within normal limits  ?CBC WITH DIFFERENTIAL/PLATELET  ?TROPONIN I (HIGH SENSITIVITY)  ? ? ?EKG ?EKG Interpretation ? ?Date/Time:  Thursday September 19 2021 13:07:47 EDT ?Ventricular Rate:  59 ?PR Interval:  144 ?QRS Duration: 88 ?QT Interval:  418 ?QTC Calculation: 413 ?R Axis:   40 ?Text Interpretation: Sinus bradycardia Low voltage QRS Borderline ECG No significant change since last tracing Confirmed by Melene PlanFloyd, Bernie Fobes [phone]([id number]) on 09/19/2021 4:01:46 PM ? ?Radiology ?DG Chest 2 View ? ?Result Date: 09/19/2021 ?CLINICAL DATA:  One day of chest pain. EXAM: CHEST - 2 VIEW COMPARISON:  September 17, 2016 FINDINGS: The heart size and mediastinal contours are within normal limits. No focal consolidation. No pleural effusion. No pneumothorax. The visualized skeletal structures are unremarkable. IMPRESSION: No acute cardiopulmonary findings. Electronically Signed   By: Maudry MayhewJeffrey  Waltz M.D.   On: 09/19/2021 14:15   ? ?Procedures ?Procedures  ? ? ?Medications Ordered in ED ?Medications - No data to display ? ?ED Course/ Medical Decision Making/ A&P ?  ?                        ?Medical Decision Making ? ?56 yo F with a chief complaint of chest pain.  This is atypical in nature and reproduced on exam.  Most likely this is musculoskeletal by history and physical.  She is sent by urgent care to be evaluated for possible MI.  Going on for 3 weeks she had a troponin that is negative EKG without ischemia chest x-ray independently interpreted by me without focal infiltrate or pneumothorax.  She is no significant anemia no significant electrolyte abnormality.  I do not feel she needs a second troponin with ongoing symptoms for 3 weeks.  We will have her follow-up with her family doctor.  Her symptoms are completely atypical for PE and I feel no further work-up is needed. ? ?4:54 PM:  I have discussed the diagnosis/risks/treatment options with the patient.  Evaluation and diagnostic testing in the emergency department  does not suggest an emergent condition requiring admission or immediate intervention beyond what has been performed at this time.  They will follow up with  PCP. We also discussed returning to the ED immediately if new or worsening sx occur. We discussed the sx which are most concerning (e.g., sudden worsening pain, fever, inability to tolerate by mout) that necessitate immediate return. Medications administered to the patient during their visit and any new prescriptions provided  to the patient are listed below. ? ?Medications given during this visit ?Medications - No data to display ? ? ?The patient appears reasonably screen and/or stabilized for discharge and I doubt any other medical condition or other [facility] requiring further screening, evaluation, or treatment in the ED at this time prior to discharge.  ? ? ? ? ? ? ? ? ?Final Clinical Impression(s) / ED Diagnoses ?Final diagnoses:  ?Nonspecific chest pain  ? ? ?Rx / DC Orders ?ED Discharge Orders   ? ? None  ? ?  ? ? ?  ?Melene Plan, DO ?09/19/21 1654 ? ?

## 2021-12-04 ENCOUNTER — Ambulatory Visit (HOSPITAL_COMMUNITY)
Admission: EM | Admit: 2021-12-04 | Discharge: 2021-12-04 | Disposition: A | Discharge status: Home / Self Care | Payer: 59 | Attending: Physician Assistant | Admitting: Physician Assistant

## 2021-12-04 ENCOUNTER — Encounter (HOSPITAL_COMMUNITY): Payer: Self-pay

## 2021-12-04 DIAGNOSIS — R030 Elevated blood-pressure reading, without diagnosis of hypertension: Secondary | ICD-10-CM

## 2021-12-04 DIAGNOSIS — H1132 Conjunctival hemorrhage, left eye: Secondary | ICD-10-CM | POA: Diagnosis not present

## 2021-12-04 MED ORDER — AMLODIPINE BESYLATE 5 MG PO TABS: 5.0000 mg | ORAL_TABLET | Freq: Every day | ORAL | 1 refills | Status: AC

## 2021-12-04 MED ORDER — ACETAMINOPHEN 325 MG PO TABS
ORAL_TABLET | ORAL | Status: AC
  Filled 2021-12-04: qty 2

## 2021-12-04 MED ORDER — OFLOXACIN 0.3 % OP SOLN: 1.0000 [drp] | Freq: Four times a day (QID) | OPHTHALMIC | 0 refills | Status: AC

## 2021-12-04 MED ORDER — ACETAMINOPHEN 325 MG PO TABS
650.0000 mg | ORAL_TABLET | Freq: Once | ORAL | Status: AC
  Administered 2021-12-04: 650 mg via ORAL

## 2021-12-04 NOTE — ED Provider Notes (Signed)
[facility]    CSN: [id number] Arrival date & time: 12/04/21  1435      History   Chief Complaint Chief Complaint  Patient presents with   Headache   Eye Pain    HPI Rachel Newton is a 56 y.o. female.   Patient presents today with a several day history of left eye redness and irritation.  She is Spanish-speaking and video interpreter was utilized during visit.  She denies any recent trauma or motor vehicle accident.  She does report that symptoms began after she went to the optometrist at [facility].  Reports that drops were placed in her eye and symptoms began the following day.  She does not take any blood thinning medication on a regular basis and denies history of bleeding disorder.  She denies any significant ocular pain but does report pain around the eye.  She denies any visual disturbance, photophobia, pain with extraocular movements, fever, nausea, vomiting, diplopia.  She denies history of autoimmune condition or recent medication changes.  She has not tried any over-the-counter medication for symptom management.  Denies any recent illness.  Her blood pressure is elevated.  She denies any chest pain, shortness of breath, vision change, dizziness.  She is not currently on antihypertensive medication.  She has had elevated blood pressure readings in the past.  Denies any recent NSAID use, decongestants, caffeine, sodium.   Past Medical History:  Diagnosis Date   Allergy 2001   Hyperlipidemia    Migraine 2015   previously treated with propranolol    Shingles 07/07/2012    Patient Active Problem List   Diagnosis Date Noted   High cholesterol 04/03/2014   Pelvic pain in female 03/02/2014   Bilateral chronic knee pain 03/02/2014   Bilateral elbow joint pain 03/02/2014    Past Surgical History:  Procedure Laterality Date   TUBAL LIGATION  1997     OB History     Gravida  3   Para  3   Term  3   Preterm      AB      Living  3      SAB       IAB      Ectopic      Multiple      Live Births               Home Medications    Prior to Admission medications   Medication Sig Start Date End Date Taking? Authorizing Provider  amLODipine (NORVASC) 5 MG tablet Take 1 tablet (5 mg total) by mouth daily. 12/04/21  Yes Vernon Ariel K, PA-C  ofloxacin (OCUFLOX) 0.3 % ophthalmic solution Place 1 drop into the left eye 4 (four) times daily. 12/04/21  Yes Ignazio Kincaid K, PA-C  baclofen (LIORESAL) 10 MG tablet Take 0.5-1 tablets (5-10 mg total) by mouth 3 (three) times daily as needed for muscle spasms. 03/07/20   Hilts, Casimiro Needle, MD  benzonatate (TESSALON) 100 MG capsule Take 1 capsule (100 mg total) by mouth every 8 (eight) hours. 07/04/21   Valinda Hoar, NP  cetirizine (ZYRTEC) 10 MG tablet Take 10 mg by mouth daily.  09/28/19   [provider]  diclofenac Sodium (VOLTAREN) 1 % GEL Apply 4 g topically 4 (four) times daily as needed. 01/24/20   Hilts, Casimiro Needle, MD  meclizine (ANTIVERT) 25 MG tablet Take 1 tablet (25 mg total) by mouth 3 (three) times daily as needed for dizziness. 04/12/20   Cardama, Amadeo Garnet,  MD  promethazine-dextromethorphan (PROMETHAZINE-DM) 6.25-15 MG/5ML syrup Take 5 mLs by mouth 4 (four) times daily as needed for cough. 07/04/21   Valinda Hoar, NP    Family History Family History  Problem Relation Age of Onset   Hypertension Mother    Cancer Neg Hx    Heart disease Neg Hx    Breast cancer Neg Hx     Social History Social History   Tobacco Use   Smoking status: Never   Smokeless tobacco: Never  Substance Use Topics   Alcohol use: No   Drug use: No     Allergies   Meloxicam   Review of Systems Review of Systems  Constitutional:  Positive for activity change. Negative for appetite change, fatigue and fever.  Eyes:  Positive for redness. Negative for photophobia, pain, discharge, itching and visual disturbance.  Respiratory:  Negative for cough and shortness of breath.    Cardiovascular:  Negative for chest pain.  Gastrointestinal:  Negative for abdominal pain, diarrhea, nausea and vomiting.  Neurological:  Positive for headaches. Negative for dizziness and light-headedness.    Physical Exam Triage Vital Signs ED Triage Vitals  Enc Vitals Group     BP 12/04/21 1459 (!) 150/81     Pulse Rate 12/04/21 1459 73     Resp 12/04/21 1459 16     Temp 12/04/21 1459 98.2 F (36.8 C)     Temp Source 12/04/21 1459 Oral     SpO2 12/04/21 1459 95 %     Weight --      Height --      Head Circumference --      Peak Flow --      Pain Score 12/04/21 1457 5     Pain Loc --      Pain Edu? --      Excl. in GC? --    No data found.  Updated Vital Signs BP (!) 165/74   Pulse 73   Temp 98.2 F (36.8 C) (Oral)   Resp 16   SpO2 95%   Visual Acuity Right Eye Distance: 20/15 (corrected) Left Eye Distance: 20/15 (corrected) Bilateral Distance: 20/15 (corrected)  Right Eye Near:   Left Eye Near:    Bilateral Near:     Physical Exam Vitals reviewed.  Constitutional:      General: She is awake. She is not in acute distress.    Appearance: Normal appearance. She is well-developed. She is not ill-appearing.     Comments: Very pleasant female appears stated age in no acute distress sitting comfortably in exam room  HENT:     Head: Normocephalic and atraumatic.  Eyes:     Extraocular Movements: Extraocular movements intact.     Conjunctiva/sclera:     Right eye: Right conjunctiva is not injected. No hemorrhage.    Left eye: Left conjunctiva is not injected. Hemorrhage present.     Pupils: Pupils are equal, round, and reactive to light.  Cardiovascular:     Rate and Rhythm: Normal rate and regular rhythm.     Heart sounds: Normal heart sounds, S1 normal and S2 normal. No murmur heard. Pulmonary:     Effort: Pulmonary effort is normal.     Breath sounds: Normal breath sounds. No wheezing, rhonchi or rales.     Comments: Clear to auscultation  bilaterally Psychiatric:        Behavior: Behavior is cooperative.     UC Treatments / Results  Labs (all labs ordered are listed, but only  abnormal results are displayed) Labs Reviewed - No data to display  EKG   Radiology No results found.  Procedures Procedures (including critical care time)  Medications Ordered in UC Medications  acetaminophen (TYLENOL) tablet 650 mg (has no administration in time range)    Initial Impression / Assessment and Plan / UC Course  I have reviewed the triage vital signs and the nursing notes.  Pertinent labs & imaging results that were available during my care of the patient were reviewed by me and considered in my medical decision making (see chart for details).     Symptoms consistent with conjunctival hemorrhage.  Patient is intact.  Given patient recently went to the eye doctor and had eyedrops placed will cover for bacterial conjunctivitis.  She was started on ofloxacin drops.  Discussed that if symptoms or not improving or if anything worsen she should follow-up with ophthalmology was given contact information for local provider.  If she has worsening symptoms including visual disturbance, ocular pain, fever, headache, dizziness that she is to go to the emergency room to which she expressed understanding.  Blood pressure is elevated today and has been elevated for several previous visits.  Discussed potential utility of following up with PCP versus initiating antihypertensive medication.  Patient is agreeable to starting a medicine.  We will start amlodipine 5 mg.  Recommended she avoid NSAIDs, caffeine, sodium, decongestants.  She is to monitor her blood pressure at home.  We will try to establish care with PCP via PCP assistance.  If she is unable to see PCP within a few weeks she should return here for recheck.  If she develops any chest pain, shortness of breath, headache, vision change, dizziness in setting of high blood pressure she is to  go to the emergency room to which she expressed understanding.  Final Clinical Impressions(s) / UC Diagnoses   Final diagnoses:  Conjunctival hemorrhage of left eye  Elevated blood pressure reading     Discharge Instructions      Use eyedrops to cover for infection.  I believe this is just a conjunctival hemorrhage and should resolve on its own but we are covering you for infection given you recently went to the eye doctor and had eyedrops applied.  If your symptoms are improving quickly please follow-up with ophthalmology; call to schedule appointment.  If anything worsens and you have visual change, increased pain, headaches, dizziness you need to be seen immediately.  Your blood pressure is elevated.  Please start amlodipine 5 mg daily.  We will try to establish you with a primary care provider.  Please follow-up in a few weeks with PCP if you are unable to see them please return here for reevaluation.  If you develop any chest pain, shortness of breath, headache, vision change, dizziness in the setting of high blood pressure you need to go to the emergency room.  Avoid NSAIDs including aspirin, ibuprofen/Advil, naproxen/Aleve.  Avoid caffeine, decongestants, sodium.     ED Prescriptions     Medication Sig Dispense Auth. Provider   ofloxacin (OCUFLOX) 0.3 % ophthalmic solution Place 1 drop into the left eye 4 (four) times daily. 5 mL Rajinder Mesick K, PA-C   amLODipine (NORVASC) 5 MG tablet Take 1 tablet (5 mg total) by mouth daily. 30 tablet Korie Brabson K, PA-C      I have reviewed the PDMP during this encounter.   Jeani HawkingRaspet, Tymira Horkey K, PA-C 12/04/21 1628

## 2021-12-04 NOTE — ED Triage Notes (Signed)
Pt reports L eye redness and c/o blood in eye x 2 days.

## 2021-12-04 NOTE — Discharge Instructions (Signed)
Use eyedrops to cover for infection.  I believe this is just a conjunctival hemorrhage and should resolve on its own but we are covering you for infection given you recently went to the eye doctor and had eyedrops applied.  If your symptoms are improving quickly please follow-up with ophthalmology; call to schedule appointment.  If anything worsens and you have visual change, increased pain, headaches, dizziness you need to be seen immediately.  Your blood pressure is elevated.  Please start amlodipine 5 mg daily.  We will try to establish you with a primary care provider.  Please follow-up in a few weeks with PCP if you are unable to see them please return here for reevaluation.  If you develop any chest pain, shortness of breath, headache, vision change, dizziness in the setting of high blood pressure you need to go to the emergency room.  Avoid NSAIDs including aspirin, ibuprofen/Advil, naproxen/Aleve.  Avoid caffeine, decongestants, sodium.

## 2021-12-12 ENCOUNTER — Encounter: Payer: Self-pay | Admitting: General Practice

## 2022-01-14 ENCOUNTER — Other Ambulatory Visit: Payer: Self-pay | Admitting: Physician Assistant

## 2022-01-14 DIAGNOSIS — Z1231 Encounter for screening mammogram for malignant neoplasm of breast: Secondary | ICD-10-CM

## 2022-01-30 ENCOUNTER — Ambulatory Visit: Payer: 59

## 2022-02-15 IMAGING — CT CT ABD-PELV W/O CM
1 of 2 series · 14 of 32 positions shown, 19 images · non-contrast
Comparison: CT abdomen 07/12/2007

CLINICAL DATA: Left lower quadrant abdominal pain radiates into
back.

EXAM:
CT ABDOMEN AND PELVIS WITHOUT CONTRAST
TECHNIQUE: Multidetector CT imaging of the abdomen and pelvis was performed
following the standard protocol without IV contrast.

[Series 2: abd/pelvis w/(date) · axial · 0.85mm/px · z∈[-461,-76]mm · 14 of 87 slices shown, 19 images]
[im 5/87  soft-tissue]
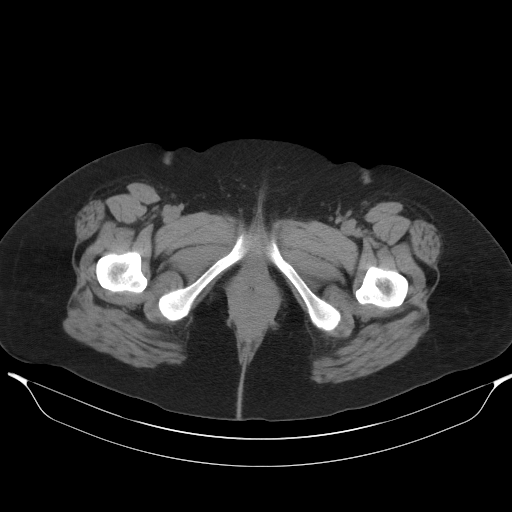
[im 5/87  bone]
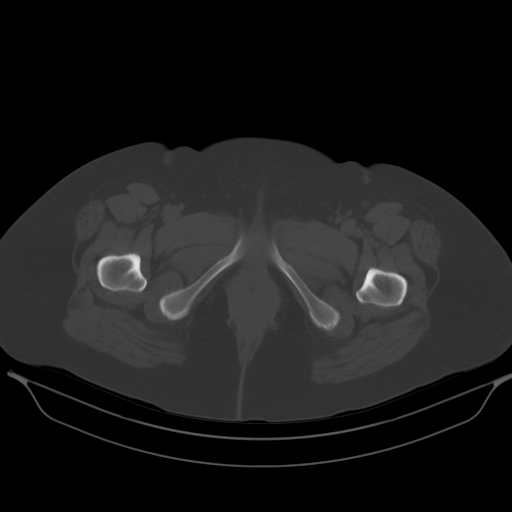
[im 14/87  soft-tissue]
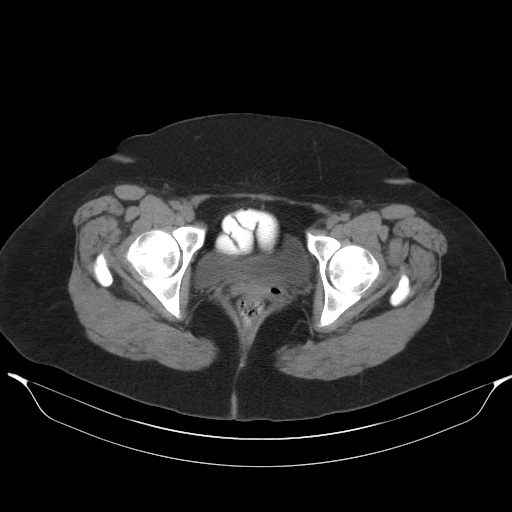
[im 19/87  soft-tissue]
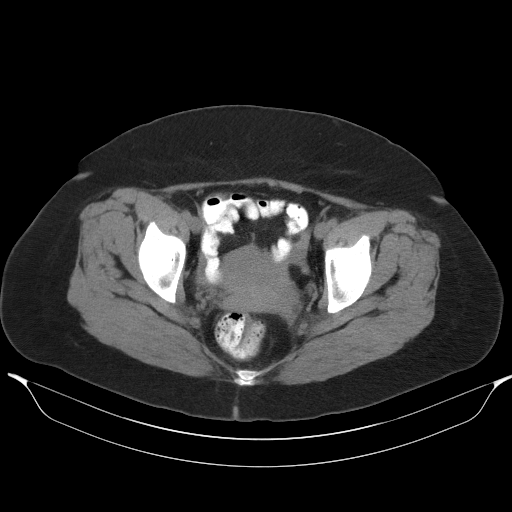
[im 23/87  soft-tissue]
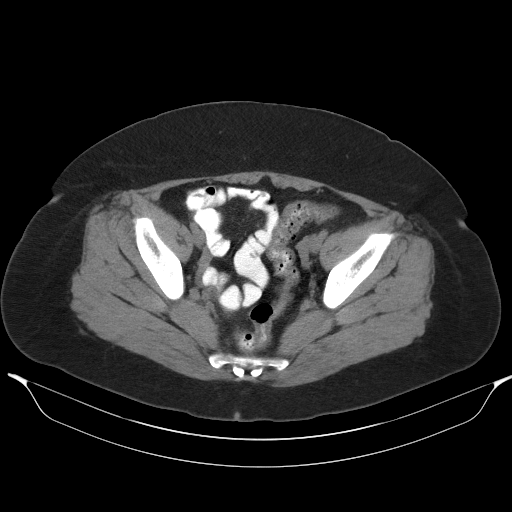
[im 32/87  soft-tissue]
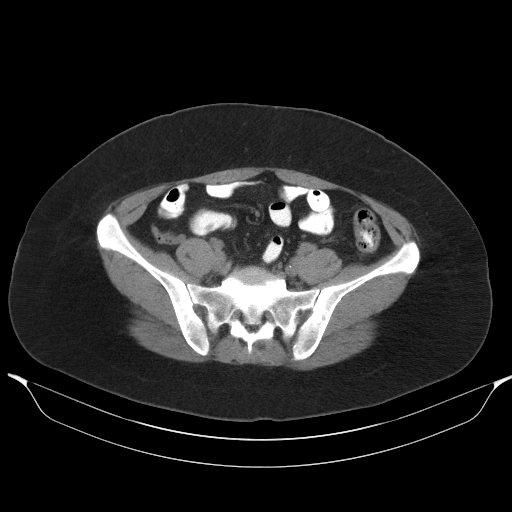
[im 37/87  soft-tissue]
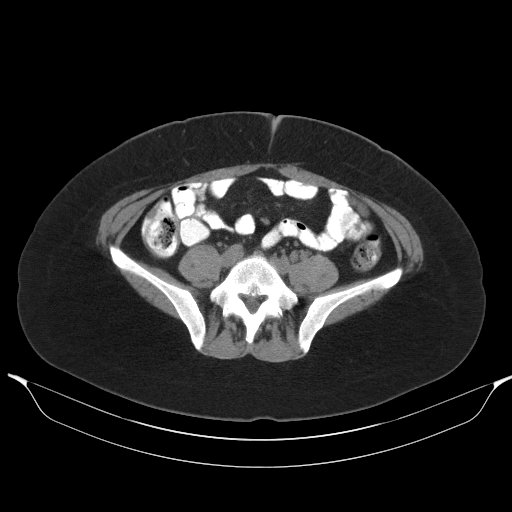
[im 46/87  soft-tissue]
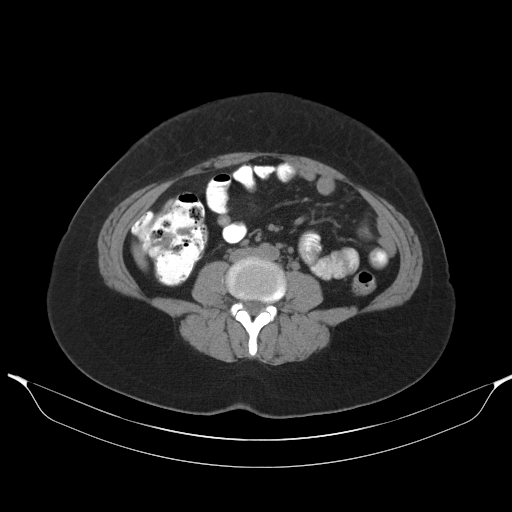
[im 50/87  soft-tissue]
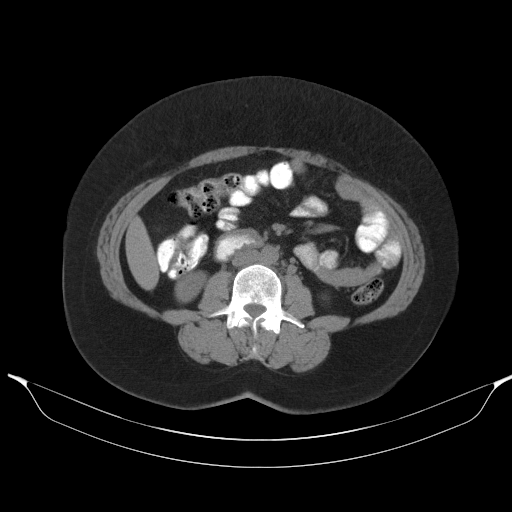
[im 55/87  soft-tissue]
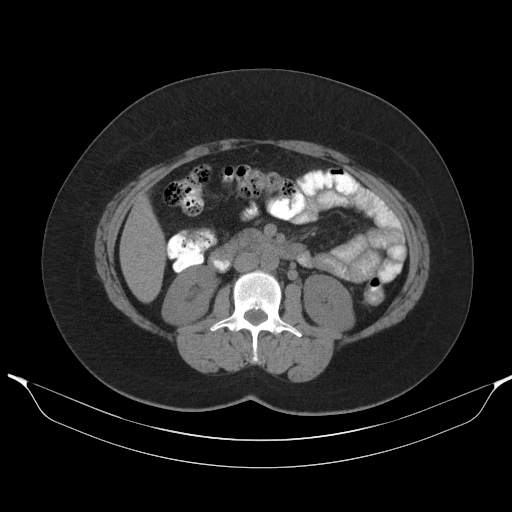
[im 55/87  bone]
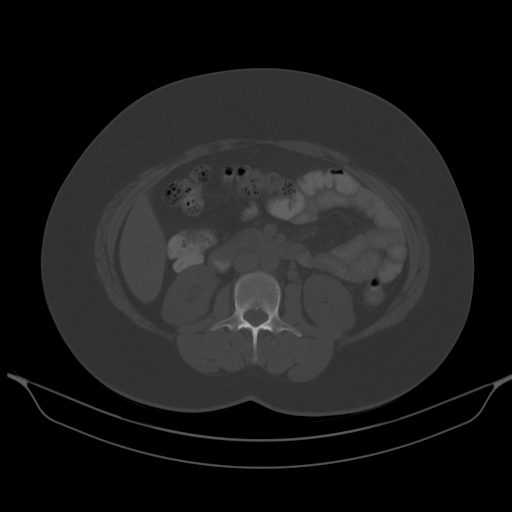
[im 64/87  soft-tissue]
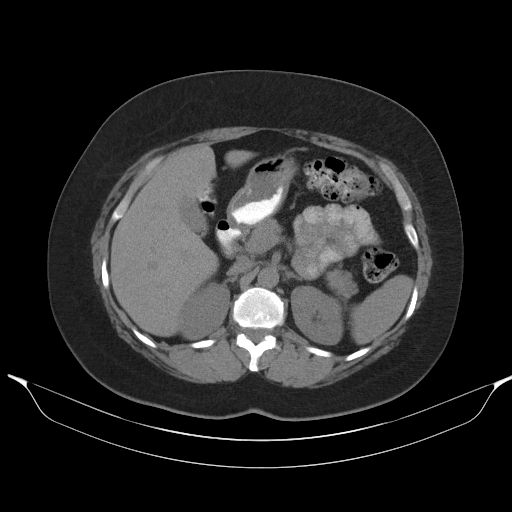
[im 68/87  soft-tissue]
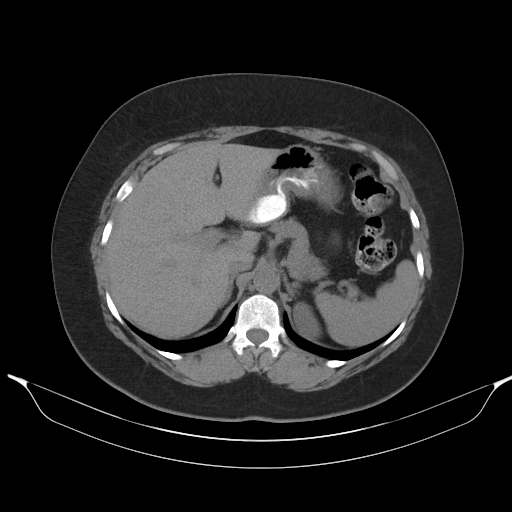
[im 68/87  lung]
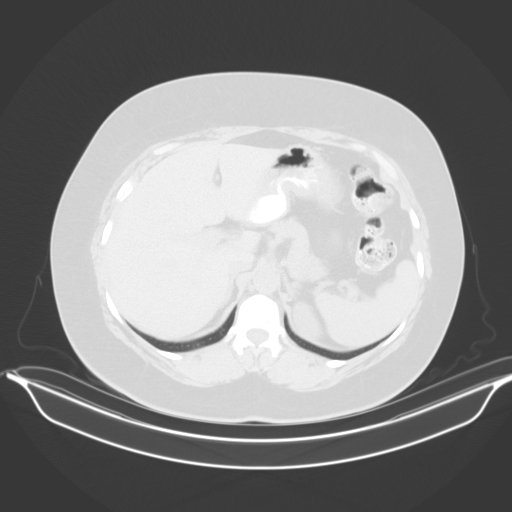
[im 73/87  soft-tissue]
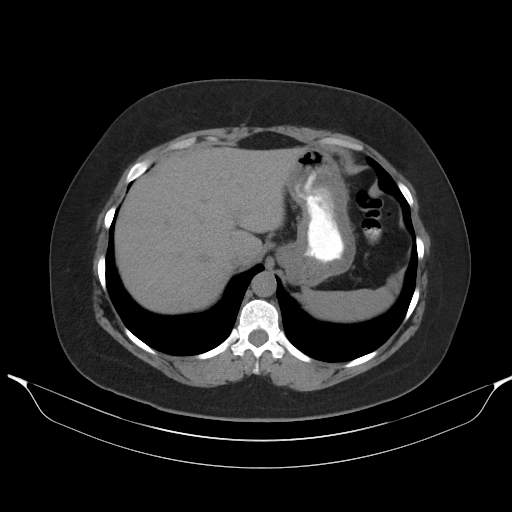
[im 73/87  lung]
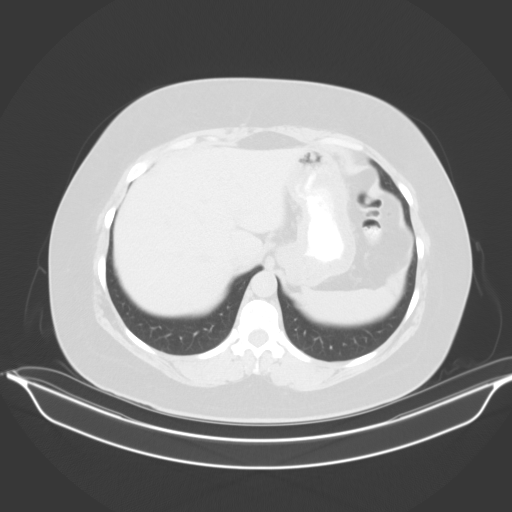
[im 77/87  lung]
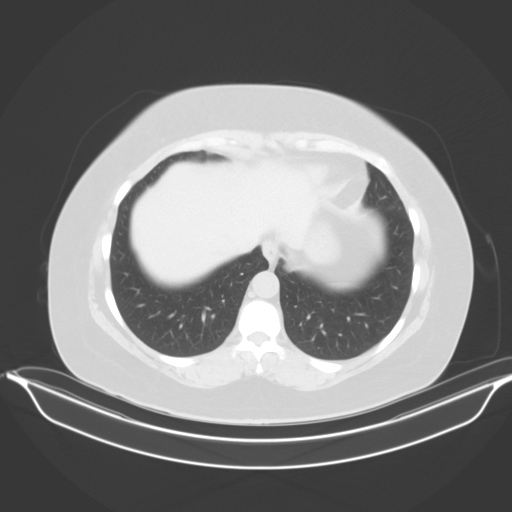
[im 82/87  soft-tissue]
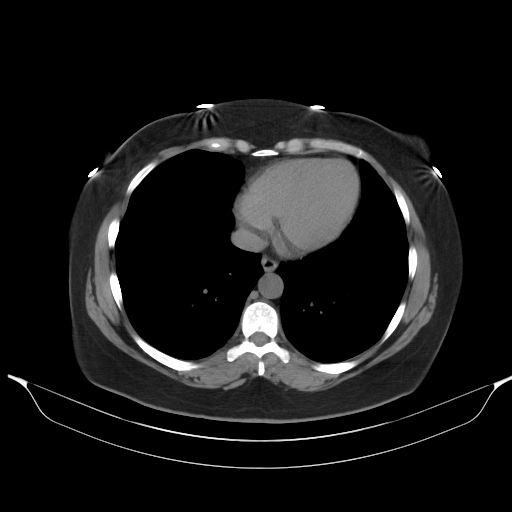
[im 82/87  lung]
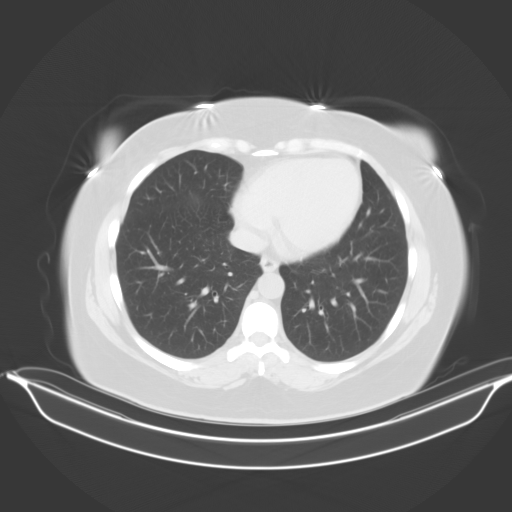

[14 of 32 positions shown; findings below may reference images not displayed]

FINDINGS: Lower chest: No acute abnormality.

Hepatobiliary: No focal liver abnormality is seen. No gallstones,
gallbladder wall thickening, or biliary dilatation.

Pancreas: Unremarkable. No pancreatic ductal dilatation or
surrounding inflammatory changes.

Spleen: Normal in size without focal abnormality.

Adrenals/Urinary Tract: Adrenal glands are unremarkable. Kidneys are
normal, without renal calculi, focal lesion, or hydronephrosis.
Bladder is unremarkable.

Stomach/Bowel: Stomach is within normal limits. Appendix appears
normal. No evidence of bowel wall thickening, distention, or
inflammatory changes. Sigmoid colon diverticulosis without evidence
of diverticulitis. Oral contrast is noted throughout the small bowel
and colon without obstruction.

Vascular/Lymphatic: Mild calcific atherosclerosis without aneurysm.
No lymphadenopathy.

Reproductive: Uterus and bilateral adnexa are unremarkable.

Other: No abdominal wall hernia or abnormality. No abdominopelvic
ascites.

Musculoskeletal: No acute or significant osseous findings.
IMPRESSION: 1. No acute findings.
2. Colonic diverticulosis without evidence of diverticulitis.

## 2022-02-19 ENCOUNTER — Ambulatory Visit
Admission: RE | Admit: 2022-02-19 | Discharge: 2022-02-19 | Disposition: A | Discharge status: Home / Self Care | Payer: PRIVATE HEALTH INSURANCE | Source: Ambulatory Visit | Attending: Physician Assistant | Admitting: Physician Assistant

## 2022-02-19 DIAGNOSIS — Z1231 Encounter for screening mammogram for malignant neoplasm of breast: Secondary | ICD-10-CM

## 2024-02-22 ENCOUNTER — Ambulatory Visit: Admitting: Family Medicine

## 2024-03-22 ENCOUNTER — Ambulatory Visit: Admitting: Internal Medicine
